# Patient Record
Sex: Male | Born: 1965 | Race: Black or African American | Hispanic: No | Marital: Single | Smoking: Former smoker
Health system: Southern US, Community
[De-identification: ages and names within clinical notes are randomized; demographics above are authoritative.]

## PROBLEM LIST (undated history)

## (undated) ENCOUNTER — Emergency Department (HOSPITAL_COMMUNITY): Source: Home / Self Care

## (undated) DIAGNOSIS — I2510 Atherosclerotic heart disease of native coronary artery without angina pectoris: Secondary | ICD-10-CM

## (undated) DIAGNOSIS — T148XXA Other injury of unspecified body region, initial encounter: Secondary | ICD-10-CM

## (undated) DIAGNOSIS — F319 Bipolar disorder, unspecified: Secondary | ICD-10-CM

## (undated) DIAGNOSIS — Z8673 Personal history of transient ischemic attack (TIA), and cerebral infarction without residual deficits: Secondary | ICD-10-CM

## (undated) DIAGNOSIS — Z9289 Personal history of other medical treatment: Secondary | ICD-10-CM

## (undated) DIAGNOSIS — N179 Acute kidney failure, unspecified: Secondary | ICD-10-CM

## (undated) DIAGNOSIS — R4182 Altered mental status, unspecified: Secondary | ICD-10-CM

## (undated) DIAGNOSIS — Y249XXA Unspecified firearm discharge, undetermined intent, initial encounter: Secondary | ICD-10-CM

## (undated) DIAGNOSIS — D696 Thrombocytopenia, unspecified: Secondary | ICD-10-CM

## (undated) DIAGNOSIS — I10 Essential (primary) hypertension: Secondary | ICD-10-CM

## (undated) DIAGNOSIS — E785 Hyperlipidemia, unspecified: Secondary | ICD-10-CM

## (undated) DIAGNOSIS — W3400XA Accidental discharge from unspecified firearms or gun, initial encounter: Secondary | ICD-10-CM

## (undated) DIAGNOSIS — R569 Unspecified convulsions: Secondary | ICD-10-CM

## (undated) DIAGNOSIS — K219 Gastro-esophageal reflux disease without esophagitis: Secondary | ICD-10-CM

## (undated) DIAGNOSIS — C641 Malignant neoplasm of right kidney, except renal pelvis: Secondary | ICD-10-CM

## (undated) DIAGNOSIS — F209 Schizophrenia, unspecified: Secondary | ICD-10-CM

## (undated) HISTORY — PX: APPENDECTOMY: SHX54

## (undated) HISTORY — DX: Acute kidney failure, unspecified: N17.9

## (undated) HISTORY — DX: Malignant neoplasm of right kidney, except renal pelvis: C64.1

## (undated) HISTORY — DX: Altered mental status, unspecified: R41.82

## (undated) HISTORY — DX: Bipolar disorder, unspecified: F31.9

## (undated) HISTORY — DX: Gastro-esophageal reflux disease without esophagitis: K21.9

## (undated) HISTORY — DX: Thrombocytopenia, unspecified: D69.6

## (undated) HISTORY — DX: Personal history of other medical treatment: Z92.89

## (undated) HISTORY — PX: OTHER SURGICAL HISTORY: SHX169

## (undated) HISTORY — DX: Hyperlipidemia, unspecified: E78.5

## (undated) HISTORY — DX: Personal history of transient ischemic attack (TIA), and cerebral infarction without residual deficits: Z86.73

---

## 2002-02-27 ENCOUNTER — Inpatient Hospital Stay (HOSPITAL_COMMUNITY): Admission: EM | Admit: 2002-02-27 | Discharge: 2002-03-01 | Payer: Self-pay

## 2003-03-30 ENCOUNTER — Emergency Department (HOSPITAL_COMMUNITY): Admission: AD | Admit: 2003-03-30 | Discharge: 2003-03-30 | Payer: Self-pay | Admitting: Emergency Medicine

## 2004-06-03 ENCOUNTER — Emergency Department (HOSPITAL_COMMUNITY): Admission: EM | Admit: 2004-06-03 | Discharge: 2004-06-03 | Payer: Self-pay | Admitting: Internal Medicine

## 2004-06-22 ENCOUNTER — Emergency Department (HOSPITAL_COMMUNITY): Admission: EM | Admit: 2004-06-22 | Discharge: 2004-06-22 | Payer: Self-pay | Admitting: Emergency Medicine

## 2004-07-31 ENCOUNTER — Emergency Department (HOSPITAL_COMMUNITY): Admission: EM | Admit: 2004-07-31 | Discharge: 2004-07-31 | Payer: Self-pay | Admitting: Emergency Medicine

## 2004-11-30 ENCOUNTER — Emergency Department (HOSPITAL_COMMUNITY): Admission: EM | Admit: 2004-11-30 | Discharge: 2004-11-30 | Payer: Self-pay | Admitting: Emergency Medicine

## 2011-04-15 ENCOUNTER — Emergency Department (HOSPITAL_COMMUNITY)
Admission: EM | Admit: 2011-04-15 | Discharge: 2011-04-16 | Disposition: A | Discharge status: Home / Self Care | Payer: Self-pay | Attending: Emergency Medicine | Admitting: Emergency Medicine

## 2011-04-15 DIAGNOSIS — E119 Type 2 diabetes mellitus without complications: Secondary | ICD-10-CM | POA: Insufficient documentation

## 2011-04-15 DIAGNOSIS — Z79899 Other long term (current) drug therapy: Secondary | ICD-10-CM | POA: Insufficient documentation

## 2011-04-15 DIAGNOSIS — R059 Cough, unspecified: Secondary | ICD-10-CM | POA: Insufficient documentation

## 2011-04-15 DIAGNOSIS — F17200 Nicotine dependence, unspecified, uncomplicated: Secondary | ICD-10-CM | POA: Insufficient documentation

## 2011-04-15 DIAGNOSIS — R05 Cough: Secondary | ICD-10-CM | POA: Insufficient documentation

## 2011-04-15 DIAGNOSIS — J069 Acute upper respiratory infection, unspecified: Secondary | ICD-10-CM | POA: Insufficient documentation

## 2011-04-15 DIAGNOSIS — I10 Essential (primary) hypertension: Secondary | ICD-10-CM | POA: Insufficient documentation

## 2011-04-15 HISTORY — DX: Essential (primary) hypertension: I10

## 2011-04-15 NOTE — ED Notes (Signed)
No answer

## 2011-04-16 ENCOUNTER — Encounter (HOSPITAL_COMMUNITY): Payer: Self-pay | Admitting: Emergency Medicine

## 2011-04-16 ENCOUNTER — Emergency Department (HOSPITAL_COMMUNITY): Payer: Self-pay

## 2011-04-16 MED ORDER — BENZONATATE 100 MG PO CAPS: 100.0000 mg | ORAL_CAPSULE | Freq: Three times a day (TID) | ORAL | Status: AC

## 2011-04-16 MED ORDER — AZITHROMYCIN 250 MG PO TABS: ORAL_TABLET | ORAL | Status: AC

## 2011-04-16 NOTE — Discharge Instructions (Signed)
Cool Mist Vaporizers Vaporizers may help relieve the symptoms of a cough and cold. By adding water to the air, mucus may become thinner and less sticky. This makes it easier to breathe and cough up secretions. Vaporizers have not been proven to show they help with colds. You should not use a vaporizer if you are allergic to mold. Cool mist vaporizers do not cause serious burns like hot mist vaporizers ("steamers"). HOME CARE INSTRUCTIONS  Follow the package instructions for your vaporizer.   Use a vaporizer that holds a large volume of water (1 to 2 gallons [5.7 to 7.5 liters]).   Do not use anything other than distilled water in the vaporizer.   Do not run the vaporizer all of the time. This can cause mold or bacteria to grow in the vaporizer.   Clean the vaporizer after each time you use it.   Clean and dry the vaporizer well before you store it.   Stop using a vaporizer if you develop worsening respiratory symptoms.  Document Released: 10/22/2003 Document Revised: 01/13/2011 Document Reviewed: 09/18/2008 [employer] Patient Information 2012 [employer]. 

## 2011-04-16 NOTE — ED Provider Notes (Signed)
History     CSN: [id number]  Arrival date & time 04/15/11  2133   First MD Initiated Contact with Patient 04/16/11 [phone]      Chief Complaint  Patient presents with  . Cough    (Consider location/radiation/quality/duration/timing/severity/associated sxs/prior treatment) Patient is a 46 y.o. male presenting with cough. The history is provided by the patient.  Cough This is a new problem. The current episode started 2 days ago. The problem occurs constantly. The problem has not changed since onset.The cough is non-productive. There has been no fever. Pertinent negatives include no chest pain, no chills, no headaches, no shortness of breath and no wheezing. Risk factors: Recently in prison. 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 He is a smoker. His past medical history does not include bronchitis, pneumonia or COPD.   patient has multiple sick contacts at home with family members with similar symptoms of cough and congestion. Moderate severity. Cough keeps him awake at night. No productive sputum or hemoptysis. No night sweats, fevers or chills. No known alleviating or aggravating factors.  Past Medical History  Diagnosis Date  . Diabetes mellitus   . Hypertension     Past Surgical History  Procedure Date  . Back surgery     No family history on file.  History  Substance Use Topics  . Smoking status: Current Everyday Smoker  . Smokeless tobacco: Not on file  . Alcohol Use: No      Review of Systems  Constitutional: Negative for fever and chills.  HENT: Negative for neck pain and neck stiffness.   Eyes: Negative for pain.  Respiratory: Positive for cough. Negative for shortness of breath, wheezing and stridor.   Cardiovascular: Negative for chest pain.  Gastrointestinal: Negative for abdominal pain.  Genitourinary: Negative for dysuria.  Musculoskeletal: Negative for back pain.  Skin: Negative for rash.  Neurological: Negative for headaches.  All other systems reviewed and are  negative.    Allergies  Penicillins and Codeine  Home Medications   Current Outpatient Rx  Name Route Sig Dispense Refill  . CARVEDILOL PO Oral Take 1 tablet by mouth daily.    . FUROSEMIDE 20 MG PO TABS Oral Take 20 mg by mouth 2 (two) times daily.    Marland Kitchen HYDROCHLOROTHIAZIDE PO Oral Take 1 tablet by mouth daily.      BP 152/103  Pulse 66  Temp 98.2 F (36.8 C)  Resp 16  SpO2 100%  Physical Exam  Constitutional: He is oriented to person, place, and time. He appears well-developed and well-nourished.  HENT:  Head: Normocephalic and atraumatic.  Eyes: Conjunctivae and EOM are normal. Pupils are equal, round, and reactive to light.  Neck: Trachea normal. Neck supple. No thyromegaly present.  Cardiovascular: Normal rate, regular rhythm, S1 normal, S2 normal and normal pulses.     No systolic murmur is present   No diastolic murmur is present  Pulses:      Radial pulses are 2+ on the right side, and 2+ on the left side.  Pulmonary/Chest: Effort normal and breath sounds normal. He has no wheezes. He has no rhonchi. He has no rales. He exhibits no tenderness.       Intermittent dry cough during exam  Abdominal: Soft. Normal appearance and bowel sounds are normal. There is no tenderness. There is no CVA tenderness and negative Murphy's sign.  Musculoskeletal:       BLE:s Calves nontender, no cords or erythema, negative Homans sign  Neurological: He is alert and oriented  to person, place, and time. He has normal strength. No cranial nerve deficit or sensory deficit. GCS eye subscore is 4. GCS verbal subscore is 5. GCS motor subscore is 6.  Skin: Skin is warm and dry. No rash noted. He is not diaphoretic.  Psychiatric: His speech is normal.       Cooperative and appropriate    ED Course  Procedures (including critical care time)  Labs Reviewed - No data to display Dg Chest 2 View  04/16/2011  *RADIOLOGY REPORT*  Clinical Data: Cough, chest pain.  CHEST - 2 VIEW  Comparison:  03/30/2003  Findings: Well aerated.  No focal consolidation.  No pleural effusion or pneumothorax.  Cardiomediastinal contours within normal limits.  No acute osseous abnormality.  IMPRESSION: No acute cardiopulmonary process.  Original Report Authenticated By: Waneta Martins, M.D.    MDM   Clinical URI symptoms with chest x-ray obtained and reviewed as above. No hypoxia. No wheezing. Plan Tylenol c codeine for cough and followup with primary care physician as needed. No symptoms of active TB.         Sunnie Nielsen, MD 04/16/11 [phone]

## 2011-04-16 NOTE — ED Notes (Signed)
PT. REPORTS PERSISTENT DRY COUGH WITH NASAL CONGESTION FOR 2 DAYS.

## 2011-07-17 ENCOUNTER — Emergency Department (HOSPITAL_COMMUNITY)
Admission: EM | Admit: 2011-07-17 | Discharge: 2011-07-17 | Disposition: A | Discharge status: Home / Self Care | Payer: Self-pay | Attending: Emergency Medicine | Admitting: Emergency Medicine

## 2011-07-17 ENCOUNTER — Encounter (HOSPITAL_COMMUNITY): Payer: Self-pay | Admitting: *Deleted

## 2011-07-17 DIAGNOSIS — F17200 Nicotine dependence, unspecified, uncomplicated: Secondary | ICD-10-CM | POA: Insufficient documentation

## 2011-07-17 DIAGNOSIS — E119 Type 2 diabetes mellitus without complications: Secondary | ICD-10-CM | POA: Insufficient documentation

## 2011-07-17 DIAGNOSIS — Z8673 Personal history of transient ischemic attack (TIA), and cerebral infarction without residual deficits: Secondary | ICD-10-CM | POA: Insufficient documentation

## 2011-07-17 DIAGNOSIS — M5430 Sciatica, unspecified side: Secondary | ICD-10-CM | POA: Insufficient documentation

## 2011-07-17 DIAGNOSIS — I10 Essential (primary) hypertension: Secondary | ICD-10-CM | POA: Insufficient documentation

## 2011-07-17 MED ORDER — PREDNISONE 20 MG PO TABS: 20.0000 mg | ORAL_TABLET | Freq: Two times a day (BID) | ORAL | Status: DC

## 2011-07-17 MED ORDER — HYDROCODONE-ACETAMINOPHEN 5-325 MG PO TABS
ORAL_TABLET | ORAL | Status: AC
  Administered 2011-07-17: 2 via ORAL
  Filled 2011-07-17: qty 2

## 2011-07-17 MED ORDER — HYDROCODONE-ACETAMINOPHEN 5-325 MG PO TABS: 1.0000 | ORAL_TABLET | ORAL | Status: DC | PRN

## 2011-07-17 MED ORDER — HYDROCODONE-ACETAMINOPHEN 5-325 MG PO TABS
2.0000 | ORAL_TABLET | Freq: Once | ORAL | Status: AC
  Administered 2011-07-17: 2 via ORAL

## 2011-07-17 NOTE — ED Notes (Signed)
Reports having pain to posterior upper right leg, thought it was due to spider or snake bite, denies any abscess or wound to leg, denies injury to leg. Ambulatory at triage.

## 2011-07-17 NOTE — Discharge Instructions (Signed)
Use heat on the sore area 3 or 4 times a day. See the Dr. of your choice if not better in one or 2 weeks. Do not drive or work when taking the narcotic pain reliever.   Sciatica Sciatica is a name for lower back pain caused by pressure on the sciatic nerve. The back pain can spread to the buttocks and back of the leg. HOME CARE   Rest as much as possible.   Only take medicine as told by your doctor.   Apply cold or heat to your back as told by your doctor.   Do not bend, lift, or sit for a long time until your pain is better.   Do not do anything that makes the condition worse.   Start normal activity when the pain is better.   Keep all follow-up visits.  GET HELP RIGHT AWAY IF:   There is more pain.   There is weakness or numbness in the legs.   You cannot control when you poop (bowel movement) or pee (urinate).  MAKE SURE YOU:   Understand these instructions.   Will watch this condition. o  Will get help right away if you are not doing well or get worse.  Document Released: 11/03/2007 Document Revised: 01/13/2011 Document Reviewed: 11/03/2007 [facility] Patient Information 2012 [place], [place].  Marland Kitchenres

## 2011-07-17 NOTE — ED Notes (Signed)
Called pt to come back to room and no answer

## 2011-07-17 NOTE — ED Notes (Signed)
Family at bedside. 

## 2011-07-17 NOTE — ED Provider Notes (Signed)
History   This chart was scribed for Flint Melter, MD scribed by Magnus Sinning. The patient was seen in room STRE5/STRE5 seen at     [facility]: [id number]  Arrival date & time 07/17/11  1413   None     Chief Complaint  Patient presents with  . Leg Pain    (Consider location/radiation/quality/duration/timing/severity/associated sxs/prior treatment) HPI Stephen [name] is a 46 y.o. male who presents to the Emergency Department complaining of constant moderate right leg pain with associated back and buttock  pain, onset 3 days.Says 2 days ago he was taking Grandmother's hydrocodone with relief, but states he had to stop because she needed medication for her hip pain treatment. Patient denies recent injury, fever, urinary or BM difficulties. Past Medical History  Diagnosis Date  . Diabetes mellitus   . Hypertension   . Stroke     Past Surgical History  Procedure Date  . Back surgery     History reviewed. No pertinent family history.  History  Substance Use Topics  . Smoking status: Current Everyday Smoker  . Smokeless tobacco: Not on file  . Alcohol Use: No      Review of Systems 10 Systems reviewed and are negative for acute change except as noted in the HPI. Allergies  Penicillins and Codeine  Home Medications   Current Outpatient Rx  Name Route Sig Dispense Refill  . CARVEDILOL PO Oral Take 1 tablet by mouth daily.    . FUROSEMIDE 20 MG PO TABS Oral Take 20 mg by mouth 2 (two) times daily.    Marland Kitchen HYDROCHLOROTHIAZIDE PO Oral Take 1 tablet by mouth daily.    Marland Kitchen HYDROCODONE-ACETAMINOPHEN 5-325 MG PO TABS Oral Take 1 tablet by mouth every 4 (four) hours as needed for pain. 20 tablet 0  . PREDNISONE 20 MG PO TABS Oral Take 1 tablet (20 mg total) by mouth 2 (two) times daily. 10 tablet 0    BP 135/84  Pulse 87  Temp(Src) 98.7 F (37.1 C) (Oral)  Resp 19  SpO2 96%  Physical Exam  Nursing note and vitals reviewed. Constitutional: He is oriented to person, place, and  time. He appears well-developed and well-nourished. No distress.  HENT:  Head: Normocephalic and atraumatic.  Eyes: Conjunctivae and EOM are normal.  Neck: Neck supple. No tracheal deviation present.  Cardiovascular: Normal rate.   Pulmonary/Chest: Effort normal. No respiratory distress.       Lungs clear  Musculoskeletal: Normal range of motion. He exhibits tenderness.       Right paravertebral lumbar tenderness.Tender posterior thigh. No deformity or abnormality.   Neurological: He is alert and oriented to person, place, and time.  Skin: Skin is warm and dry.  Psychiatric: He has a normal mood and affect. His behavior is normal.    ED Course  Procedures (including critical care time) DIAGNOSTIC STUDIES: Oxygen Saturation is 96% on room air, normal by my interpretation.    COORDINATION OF CARE:  Labs Reviewed - No data to display No results found.   1. Sciatica       MDM  Sciatica, cause unclear. Doubt spinal stenosis, spinal myelopathy, or occult fracture.   I personally performed the services described in this documentation, which was scribed in my presence. The recorded information has been reviewed and considered.    Plan: Home Medications- prednisone and Norco; Home Treatments- Heat treatment; Recommended follow up- PCP prn        Flint Melter, MD 07/17/11 1940

## 2011-07-22 ENCOUNTER — Encounter (HOSPITAL_COMMUNITY): Payer: Self-pay | Admitting: Emergency Medicine

## 2011-07-22 ENCOUNTER — Emergency Department (HOSPITAL_COMMUNITY)
Admission: EM | Admit: 2011-07-22 | Discharge: 2011-07-23 | Disposition: A | Discharge status: Home / Self Care | Payer: Self-pay | Attending: Emergency Medicine | Admitting: Emergency Medicine

## 2011-07-22 DIAGNOSIS — Z885 Allergy status to narcotic agent status: Secondary | ICD-10-CM | POA: Insufficient documentation

## 2011-07-22 DIAGNOSIS — M5430 Sciatica, unspecified side: Secondary | ICD-10-CM | POA: Insufficient documentation

## 2011-07-22 DIAGNOSIS — I10 Essential (primary) hypertension: Secondary | ICD-10-CM | POA: Insufficient documentation

## 2011-07-22 DIAGNOSIS — M545 Low back pain, unspecified: Secondary | ICD-10-CM | POA: Insufficient documentation

## 2011-07-22 DIAGNOSIS — Z8673 Personal history of transient ischemic attack (TIA), and cerebral infarction without residual deficits: Secondary | ICD-10-CM | POA: Insufficient documentation

## 2011-07-22 DIAGNOSIS — M549 Dorsalgia, unspecified: Secondary | ICD-10-CM

## 2011-07-22 DIAGNOSIS — Z87828 Personal history of other (healed) physical injury and trauma: Secondary | ICD-10-CM | POA: Insufficient documentation

## 2011-07-22 DIAGNOSIS — Z880 Allergy status to penicillin: Secondary | ICD-10-CM | POA: Insufficient documentation

## 2011-07-22 DIAGNOSIS — F17200 Nicotine dependence, unspecified, uncomplicated: Secondary | ICD-10-CM | POA: Insufficient documentation

## 2011-07-22 DIAGNOSIS — Z79899 Other long term (current) drug therapy: Secondary | ICD-10-CM | POA: Insufficient documentation

## 2011-07-22 HISTORY — DX: Unspecified firearm discharge, undetermined intent, initial encounter: Y24.9XXA

## 2011-07-22 HISTORY — DX: Other injury of unspecified body region, initial encounter: T14.8XXA

## 2011-07-22 HISTORY — DX: Accidental discharge from unspecified firearms or gun, initial encounter: W34.00XA

## 2011-07-22 NOTE — ED Notes (Signed)
[id number]> Expected date:<BR> Expected time:<BR> Means of arrival:<BR> Comments:<BR> EMS 211 GC, 46 yom back pain
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
## 2011-07-23 ENCOUNTER — Emergency Department (HOSPITAL_COMMUNITY): Payer: Self-pay

## 2011-07-23 ENCOUNTER — Encounter (HOSPITAL_COMMUNITY): Payer: Self-pay | Admitting: Emergency Medicine

## 2011-07-23 LAB — BASIC METABOLIC PANEL
Calcium: 9.9 mg/dL (ref 8.4–10.5)
Creatinine, Ser: 1.34 mg/dL (ref 0.50–1.35)
GFR calc Af Amer: 72 mL/min — ABNORMAL LOW (ref >90)
GFR calc non Af Amer: 62 mL/min — ABNORMAL LOW (ref >90)
Sodium: 138 mEq/L (ref 135–145)

## 2011-07-23 LAB — CBC
MCH: 30.5 pg (ref 26.0–34.0)
MCHC: 34.8 g/dL (ref 30.0–36.0)
MCV: 87.5 fL (ref 78.0–100.0)
Platelets: 144 10*3/uL — ABNORMAL LOW (ref 150–400)
RDW: 12.9 % (ref 11.5–15.5)

## 2011-07-23 LAB — URINALYSIS, ROUTINE W REFLEX MICROSCOPIC
Glucose, UA: NEGATIVE mg/dL
Leukocytes, UA: NEGATIVE
Nitrite: NEGATIVE
Protein, ur: NEGATIVE mg/dL
Urobilinogen, UA: 1 mg/dL (ref 0.0–1.0)

## 2011-07-23 MED ORDER — NAPROXEN 500 MG PO TABS: 500.0000 mg | ORAL_TABLET | Freq: Two times a day (BID) | ORAL | Status: DC

## 2011-07-23 MED ORDER — OXYCODONE-ACETAMINOPHEN 10-325 MG PO TABS: 1.0000 | ORAL_TABLET | ORAL | Status: DC | PRN

## 2011-07-23 MED ORDER — DEXTROSE 50 % IV SOLN
1.0000 | Freq: Once | INTRAVENOUS | Status: DC
  Filled 2011-07-23: qty 50

## 2011-07-23 MED ORDER — OXYCODONE-ACETAMINOPHEN 5-325 MG PO TABS
2.0000 | ORAL_TABLET | Freq: Once | ORAL | Status: AC
  Administered 2011-07-23: 2 via ORAL
  Filled 2011-07-23: qty 2

## 2011-07-23 NOTE — ED Provider Notes (Signed)
Medical screening examination/treatment/procedure(s) were performed by non-physician practitioner and as supervising physician I was immediately available for consultation/collaboration.  Kedron Uno M Jeane Cashatt, MD 07/23/11 2141 

## 2011-07-23 NOTE — Discharge Instructions (Signed)
You were seen and evaluated today for your complaints of persistent low back pains. At this time your providers feel that you should continue to followup with your complaints to primary care provider for further evaluation and treatment.   Back Pain, Adult Low back pain is very common. About 1 in 5 people have back pain.The cause of low back pain is rarely dangerous. The pain often gets better over time.About half of people with a sudden onset of back pain feel better in just 2 weeks. About 8 in 10 people feel better by 6 weeks.  CAUSES Some common causes of back pain include:  Strain of the muscles or ligaments supporting the spine.   Wear and tear (degeneration) of the spinal discs.   Arthritis.   Direct injury to the back.  DIAGNOSIS Most of the time, the direct cause of low back pain is not known.However, back pain can be treated effectively even when the exact cause of the pain is unknown.Answering your caregiver's questions about your overall health and symptoms is one of the most accurate ways to make sure the cause of your pain is not dangerous. If your caregiver needs more information, he or she may order lab work or imaging tests (X-rays or MRIs).However, even if imaging tests show changes in your back, this usually does not require surgery. HOME CARE INSTRUCTIONS For many people, back pain returns.Since low back pain is rarely dangerous, it is often a condition that people can learn to [facility] their own.   Remain active. It is stressful on the back to sit or stand in one place. Do not sit, drive, or stand in one place for more than 30 minutes at a time. Take short walks on level surfaces as soon as pain allows.Try to increase the length of time you walk each day.   Do not stay in bed.Resting more than 1 or 2 days can delay your recovery.   Do not avoid exercise or work.Your body is made to move.It is not dangerous to be active, even though your back may hurt.Your back  will likely heal faster if you return to being active before your pain is gone.   Pay attention to your body when you bend and lift. Many people have less discomfortwhen lifting if they bend their knees, keep the load close to their bodies,and avoid twisting. Often, the most comfortable positions are those that put less stress on your recovering back.   Find a comfortable position to sleep. Use a firm mattress and lie on your side with your knees slightly bent. If you lie on your back, put a pillow under your knees.   Only take over-the-counter or prescription medicines as directed by your caregiver. Over-the-counter medicines to reduce pain and inflammation are often the most helpful.Your caregiver may prescribe muscle relaxant drugs.These medicines help dull your pain so you can more quickly return to your normal activities and healthy exercise.   Put ice on the injured area.   Put ice in a plastic bag.   Place a towel between your skin and the bag.   Leave the ice on for 15 to 20 minutes, 3 to 4 times a day for the first 2 to 3 days. After that, ice and heat may be alternated to reduce pain and spasms.   Ask your caregiver about trying back exercises and gentle massage. This may be of some benefit.   Avoid feeling anxious or stressed.Stress increases muscle tension and can worsen back pain.It is  important to recognize when you are anxious or stressed and learn ways to manage it.Exercise is a great option.  SEEK MEDICAL CARE IF:  You have pain that is not relieved with rest or medicine.   You have pain that does not improve in 1 week.   You have new symptoms.   You are generally not feeling well.  SEEK IMMEDIATE MEDICAL CARE IF:   You have pain that radiates from your back into your legs.   You develop new bowel or bladder control problems.   You have unusual weakness or numbness in your arms or legs.   You develop nausea or vomiting.   You develop abdominal pain.    You feel faint.  Document Released: 01/24/2005 Document Revised: 01/13/2011 Document Reviewed: 06/14/2010 [facility] Patient Information 2012 [place], [place].  Back Exercises Back exercises help treat and prevent back injuries. The goal of back exercises is to increase the strength of your abdominal and back muscles and the flexibility of your back. These exercises should be started when you no longer have back pain. Back exercises include:  Pelvic Tilt. Lie on your back with your knees bent. Tilt your pelvis until the lower part of your back is against the floor. Hold this position 5 to 10 sec and repeat 5 to 10 times.   Knee to Chest. Pull first 1 knee up against your chest and hold for 20 to 30 seconds, repeat this with the other knee, and then both knees. This may be done with the other leg straight or bent, whichever feels better.   Sit-Ups or Curl-Ups. Bend your knees 90 degrees. Start with tilting your pelvis, and do a partial, slow sit-up, lifting your trunk only 30 to 45 degrees off the floor. Take at least 2 to 3 seconds for each sit-up. Do not do sit-ups with your knees out straight. If partial sit-ups are difficult, simply do the above but with only tightening your abdominal muscles and holding it as directed.   Hip-Lift. Lie on your back with your knees flexed 90 degrees. Push down with your feet and shoulders as you raise your hips a couple inches off the floor; hold for 10 seconds, repeat 5 to 10 times.   Back arches. Lie on your stomach, propping yourself up on bent elbows. Slowly press on your hands, causing an arch in your low back. Repeat 3 to 5 times. Any initial stiffness and discomfort should lessen with repetition over time.   Shoulder-Lifts. Lie face down with arms beside your body. Keep hips and torso pressed to floor as you slowly lift your head and shoulders off the floor.  Do not overdo your exercises, especially in the beginning. Exercises may cause you some mild back  discomfort which lasts for a few minutes; however, if the pain is more severe, or lasts for more than 15 minutes, do not continue exercises until you see your caregiver. Improvement with exercise therapy for back problems is slow.  See your caregivers for assistance with developing a proper back exercise program. Document Released: 03/03/2004 Document Revised: 01/13/2011 Document Reviewed: 01/24/2005 [facility] Patient Information 2012 [place], [place].     RESOURCE GUIDE  Chronic Pain Problems: Contact [facility]  [phone] Patients need to be referred by their primary care doctor.  Insufficient Money for Medicine: Contact United Way:  call "211" or [facility] 8071603302.  No Primary Care Doctor: - Call [facility]  [phone] - can help you locate a primary care  doctor that  accepts your insurance, provides certain services, etc. - Physician Referral Service- [phone]  Agencies that provide inexpensive medical care: - [facility]  [phone] - [facility]  [phone] - [facility]  [phone] [facility]  [phone] - Planned Parenthood  [phone] [facility]  [phone]  Medicaid-accepting [facility] Providers: - [facility]- [address]  [phone], Mon-Fri 9am-7pm, Sat 9am-1pm - [facility]- [address] [place], [address] [place]  [phone] - [facility]- [address] [place]  [phone] [facility]- [address]  [phone] - Renaye Rakers- [address] [place], [address], [phone]  Only accepts [place] patients after they have their name  applied to their card  Self Pay (no insurance) in [place]: - Sickle Cell Patients: Dr Willey Blade, [facility]  [address] [place], [phone] - [facility]- [address] [place]  [phone]       [facility] [place]- [address]       -     [facility]- see information above (Speak to [employer] if you do not have insurance)       -  [facility]- [address] [place], [phone]       -  [facility]- [address],  [phone]       -  [facility]- [address], [phone]       -  Dr Julio Sicks-  [address], [place], [phone]       -  [facility] Inpatient [facility]gent Care- [address] [phone]       -  [facility]- [address], [phone], also [address] [phone]       -    [facility]- [address] [place], [phone], 1st & 3rd Saturday   every month, 10am-1pm  1) Find a Doctor and Pay Out of Pocket Although you won't have to find out who is covered by your insurance plan, it is a good idea to ask around and get recommendations. You will then need to call the office and see if the doctor you have chosen will accept you as a new patient and what types of options they offer for patients who are self-pay. Some doctors offer discounts or will set up payment plans for their patients who do not have insurance, but you will need to ask so you aren't surprised when you get to your appointment.  2) Contact Your Local Health Department Not all health departments have doctors that can see patients for sick visits, but many do, so it is worth a call to see if yours does. If you don't know where your local health department is, you can check in your phone book. The CDC also has a tool to help you locate your state's health department, and many state websites also have listings of all of their local health departments.  3) Find a Walk-in Clinic If your illness is not likely to be very severe or complicated, you may want to try a walk in clinic. These are popping up all over the country in pharmacies, drugstores, and shopping centers. They're usually staffed by nurse practitioners or  physician assistants  that have been trained to treat common illnesses and complaints. They're usually fairly quick and inexpensive. However, if you have serious medical issues or chronic medical problems, these are probably not your best option  STD Testing - [facility] Department of [facility], STD Clinic, [address], [place], phone [phone] or [phone].  Monday - Friday, call for an appointment. [facility] Department of [employer], STD Clinic, [address], [place], phone [phone] or [phone].  Monday - Friday, call for an appointment.  Abuse/Neglect: [facility] Child Abuse Hotline 805-561-4249 [facility] Child Abuse Hotline (281)592-3193 (After Hours)  Emergency Shelter:  [facility] 9103511736  Maternity Homes: - [facility] [place] [facility] [phone] - [facility] [phone]) [phone]  MRSA Hotline #:   779-460-2667  [facility] Resources  [facility] [place]  [employer] [facility] Dept. [address]                 [address]         [address] [place] [address]  Blondell Reveal Phone:  [phone]                                  Phone:  [phone]                   Phone:  [phone]  [facility] Mental [facility], [phone] - [phone]       -     [facility] in [place], [address]                                  [phone], [facility] Child Abuse Hotline 416-747-9801 or (361)032-6754 (After Hours)   Behavioral Health Services  Substance Abuse Resources: - [facility] Drug [facility]  [phone] - [facility] [phone] - [facility] [phone] Floydene Flock [phone] - Residential & Outpatient  Substance Abuse Program  2204051879  Psychological Services: [facility]  [phone] Services  [phone] - [facility], [phone] [place]. [address], [place], ACCESS LINE: (443) 767-3539 or [phone], [employer]  Dental Assistance  If unable to pay or uninsured, contact:  [facility] or [facility] to become qualified for the adult dental clinic.  Patients with Medicaid: [facility] [phone], [phone] [address], [phone]  If unable to pay, or uninsured, contact [facility] [phone]) or [facility] Department [phone] in [place], [phone] in [facility]) to become qualified for the adult dental clinic  Other Low-Cost Community Dental Services: - [facility]- [address] [place], [place], [place], [address], [phone], [address]. [phone], 2nd and 4th Thursday of the month at 6:30am.  10 clients  each day by appointment, can sometimes see walk-in patients if someone does not show for an appointment. [facility]- [address] [place], [place], [address], [phone] - [facility]- [address], [place], [place], [address], [phone] - [facility]- [phone] [facility] Health Department- [phone] [facility] Department- [phone]
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
## 2011-07-23 NOTE — ED Notes (Signed)
Pt AAOx4. Given 6 honey graham crackers, 42 grams (1.5 oz) of peanut butter, 222 ml of diet coca cola, and 118 ml of orange juice.

## 2011-07-25 ENCOUNTER — Emergency Department (HOSPITAL_COMMUNITY)
Admission: EM | Admit: 2011-07-25 | Discharge: 2011-07-25 | Disposition: A | Discharge status: Home / Self Care | Payer: Self-pay | Attending: Emergency Medicine | Admitting: Emergency Medicine

## 2011-07-25 ENCOUNTER — Encounter (HOSPITAL_COMMUNITY): Payer: Self-pay | Admitting: *Deleted

## 2011-07-25 DIAGNOSIS — I10 Essential (primary) hypertension: Secondary | ICD-10-CM | POA: Insufficient documentation

## 2011-07-25 DIAGNOSIS — F17200 Nicotine dependence, unspecified, uncomplicated: Secondary | ICD-10-CM | POA: Insufficient documentation

## 2011-07-25 DIAGNOSIS — Z8673 Personal history of transient ischemic attack (TIA), and cerebral infarction without residual deficits: Secondary | ICD-10-CM | POA: Insufficient documentation

## 2011-07-25 DIAGNOSIS — M5431 Sciatica, right side: Secondary | ICD-10-CM

## 2011-07-25 DIAGNOSIS — M5430 Sciatica, unspecified side: Secondary | ICD-10-CM | POA: Insufficient documentation

## 2011-07-25 MED ORDER — CYCLOBENZAPRINE HCL 10 MG PO TABS
10.0000 mg | ORAL_TABLET | Freq: Once | ORAL | Status: AC
  Administered 2011-07-25: 10 mg via ORAL
  Filled 2011-07-25: qty 1

## 2011-07-25 MED ORDER — NAPROXEN 500 MG PO TABS: 500.0000 mg | ORAL_TABLET | Freq: Two times a day (BID) | ORAL | Status: DC

## 2011-07-25 MED ORDER — PREDNISONE 50 MG PO TABS: 50.0000 mg | ORAL_TABLET | Freq: Every day | ORAL | Status: DC

## 2011-07-25 MED ORDER — CYCLOBENZAPRINE HCL 10 MG PO TABS: 10.0000 mg | ORAL_TABLET | Freq: Three times a day (TID) | ORAL | Status: DC | PRN

## 2011-07-25 MED ORDER — HYDROCODONE-ACETAMINOPHEN 5-325 MG PO TABS
1.0000 | ORAL_TABLET | Freq: Once | ORAL | Status: AC
  Administered 2011-07-25: 1 via ORAL
  Filled 2011-07-25: qty 1

## 2011-07-25 MED ORDER — HYDROCODONE-ACETAMINOPHEN 5-325 MG PO TABS: 1.0000 | ORAL_TABLET | ORAL | Status: DC | PRN

## 2011-07-25 MED ORDER — PREDNISONE 20 MG PO TABS
60.0000 mg | ORAL_TABLET | Freq: Once | ORAL | Status: AC
  Administered 2011-07-25: 60 mg via ORAL
  Filled 2011-07-25: qty 1

## 2011-07-25 MED ORDER — IBUPROFEN 800 MG PO TABS
800.0000 mg | ORAL_TABLET | Freq: Once | ORAL | Status: AC
  Administered 2011-07-25: 800 mg via ORAL
  Filled 2011-07-25: qty 1

## 2011-07-25 NOTE — ED Notes (Signed)
Buss pass requsted and given at d/c

## 2011-07-25 NOTE — ED Notes (Signed)
[facility] on Thursday. States that he needs pain medication to hold him over till then.

## 2011-07-25 NOTE — ED Notes (Signed)
Pt c/o pain in his lower back for 3 weeks.  He has been here x 3 for the same.  He is requesting percocet.

## 2011-07-25 NOTE — Discharge Instructions (Signed)
Please keep your appointment with [facility].  Sciatica Sciatica is a weakness and/or changes in sensation (tingling, jolts, hot and cold, numbness) along the path the sciatic nerve travels. Irritation or damage to lumbar nerve roots is often also referred to as lumbar radiculopathy.  Lumbar radiculopathy (Sciatica) is the most common form of this problem. Radiculopathy can occur in any of the nerves coming out of the spinal cord. The problems caused depend on which nerves are involved. The sciatic nerve is the large nerve supplying the branches of nerves going from the hip to the toes. It often causes a numbness or weakness in the skin and/or muscles that the sciatic nerve serves. It also may cause symptoms (problems) of pain, burning, tingling, or electric shock-like feelings in the path of this nerve. This usually comes from injury to the fibers that make up the sciatic nerve. Some of these symptoms are low back pain and/or unpleasant feelings in the following areas:  From the mid-buttock down the back of the leg to the back of the knee.   And/or the outside of the calf and top of the foot.   And/or behind the inner ankle to the sole of the foot.  CAUSES   Herniated or slipped disc. Discs are the little cushions between the bones in the back.   Pressure by the piriformis muscle in the buttock on the sciatic nerve (Piriformis Syndrome).   Misalignment of the bones in the lower back and buttocks (Sacroiliac Joint Derangement).   Narrowing of the spinal canal that puts pressure on or pinches the fibers that make up the sciatic nerve.   A slipped vertebra that is out of line with those above or beneath it.   Abnormality of the nervous system itself so that nerve fibers do not transmit signals properly, especially to feet and calves (neuropathy).   Tumor (this is rare).  Your caregiver can usually determine the cause of your sciatica and begin the treatment most likely to help  you. TREATMENT  Taking over-the-counter painkillers, physical therapy, rest, exercise, spinal manipulation, and injections of anesthetics and/or steroids may be used. Surgery, acupuncture, and Yoga can also be effective. Mind over matter techniques, mental imagery, and changing factors such as your bed, chair, desk height, posture, and activities are other treatments that may be helpful. You and your caregiver can help determine what is best for you. With proper diagnosis, the cause of most sciatica can be identified and removed. Communication and cooperation between your caregiver and you is essential. If you are not successful immediately, do not be discouraged. With time, a proper treatment can be found that will make you comfortable. HOME CARE INSTRUCTIONS   If the pain is coming from a problem in the back, applying ice to that area for 15 to 20 minutes, 3 to 4 times per day while awake, may be helpful. Put the ice in a plastic bag. Place a towel between the bag of ice and your skin.   You may exercise or perform your usual activities if these do not aggravate your pain, or as suggested by your caregiver.   Only take over-the-counter or prescription medicines for pain, discomfort, or fever as directed by your caregiver.   If your caregiver has given you a follow-up appointment, it is very important to keep that appointment. Not keeping the appointment could result in a chronic or permanent injury, pain, and disability. If there is any problem keeping the appointment, you must call back to this  facility for assistance.  SEEK IMMEDIATE MEDICAL CARE IF:   You experience loss of control of bowel or bladder.   You have increasing weakness in the trunk, buttocks, or legs.   There is numbness in any areas from the hip down to the toes.   You have difficulty walking or keeping your balance.   You have any of the above, with fever or forceful vomiting.  Document Released: 01/18/2001 Document  Revised: 01/13/2011 Document Reviewed: 09/07/2007 [facility] Patient Information 2012 [place], [place].  Naproxen and naproxen sodium oral immediate-release tablets What is this medicine? NAPROXEN (na PROX en) is a non-steroidal anti-inflammatory drug (NSAID). It is used to reduce swelling and to treat pain. This medicine may be used for dental pain, headache, or painful monthly periods. It is also used for painful joint and muscular problems such as arthritis, tendinitis, bursitis, and gout. This medicine may be used for other purposes; ask your health care provider or pharmacist if you have questions. What should I tell my health care provider before I take this medicine? They need to know if you have any of these conditions: -asthma -cigarette smoker -drink more than 3 alcohol containing drinks a day -heart disease or circulation problems such as heart failure or leg edema (fluid retention) -high blood pressure -kidney disease -liver disease -stomach bleeding or ulcers -an unusual or allergic reaction to naproxen, aspirin, other NSAIDs, other medicines, foods, dyes, or preservatives -pregnant or trying to get pregnant -breast-feeding How should I use this medicine? Take this medicine by mouth with a glass of water. Follow the directions on the prescription label. Take it with food if your stomach gets upset. Try to not lie down for at least 10 minutes after you take it. Take your medicine at regular intervals. Do not take your medicine more often than directed. Long-term, continuous use may increase the risk of heart attack or stroke. A special MedGuide will be given to you by the pharmacist with each prescription and refill. Be sure to read this information carefully each time. Talk to your pediatrician regarding the use of this medicine in children. Special care may be needed. Overdosage: If you think you have taken too much of this medicine contact a poison control center or emergency room  at once. NOTE: This medicine is only for you. Do not share this medicine with others. What if I miss a dose? If you miss a dose, take it as soon as you can. If it is almost time for your next dose, take only that dose. Do not take double or extra doses. What may interact with this medicine? -alcohol -aspirin -cidofovir -diuretics -lithium -methotrexate -other drugs for inflammation like ketorolac or prednisone -pemetrexed -probenecid -warfarin This list may not describe all possible interactions. Give your health care provider a list of all the medicines, herbs, non-prescription drugs, or dietary supplements you use. Also tell them if you smoke, drink alcohol, or use illegal drugs. Some items may interact with your medicine. What should I watch for while using this medicine? Tell your doctor or health care professional if your pain does not get better. Talk to your doctor before taking another medicine for pain. Do not treat yourself. This medicine does not prevent heart attack or stroke. In fact, this medicine may increase the chance of a heart attack or stroke. The chance may increase with longer use of this medicine and in people who have heart disease. If you take aspirin to prevent heart attack or stroke, talk with  your doctor or health care professional. Do not take other medicines that contain aspirin, ibuprofen, or naproxen with this medicine. Side effects such as stomach upset, nausea, or ulcers may be more likely to occur. Many medicines available without a prescription should not be taken with this medicine. This medicine can cause ulcers and bleeding in the stomach and intestines at any time during treatment. Do not smoke cigarettes or drink alcohol. These increase irritation to your stomach and can make it more susceptible to damage from this medicine. Ulcers and bleeding can happen without warning symptoms and can cause death. You may get drowsy or dizzy. Do not drive, use  machinery, or do anything that needs mental alertness until you know how this medicine affects you. Do not stand or sit up quickly, especially if you are an older patient. This reduces the risk of dizzy or fainting spells. This medicine can cause you to bleed more easily. Try to avoid damage to your teeth and gums when you brush or floss your teeth. What side effects may I notice from receiving this medicine? Side effects that you should report to your doctor or health care professional as soon as possible: -black or bloody stools, blood in the urine or vomit -blurred vision -chest pain -difficulty breathing or wheezing -nausea or vomiting -severe stomach pain -skin rash, skin redness, blistering or peeling skin, hives, or itching -slurred speech or weakness on one side of the body -swelling of eyelids, throat, lips -unexplained weight gain or swelling -unusually weak or tired -yellowing of eyes or skin Side effects that usually do not require medical attention (report to your doctor or health care professional if they continue or are bothersome): -constipation -headache -heartburn This list may not describe all possible side effects. Call your doctor for medical advice about side effects. You may report side effects to FDA at 1-800-FDA-1088. Where should I keep my medicine? Keep out of the reach of children. Store at room temperature between 15 and 30 degrees C (59 and 86 degrees F). Keep container tightly closed. Throw away any unused medicine after the expiration date. NOTE: This sheet is a summary. It may not cover all possible information. If you have questions about this medicine, talk to your doctor, pharmacist, or health care provider.  2012, [employer]. (01/26/2009 8:10:16 PM)  Cyclobenzaprine tablets What is this medicine? CYCLOBENZAPRINE (sye kloe BEN za preen) is a muscle relaxer. It is used to treat muscle pain, spasms, and stiffness. This medicine may be used  for other purposes; ask your health care provider or pharmacist if you have questions. What should I tell my health care provider before I take this medicine? They need to know if you have any of these conditions: -heart disease, irregular heartbeat, or previous heart attack -liver disease -thyroid problem -an unusual or allergic reaction to cyclobenzaprine, tricyclic antidepressants, lactose, other medicines, foods, dyes, or preservatives -pregnant or trying to get pregnant -breast-feeding How should I use this medicine? Take this medicine by mouth with a glass of water. Follow the directions on the prescription label. If this medicine upsets your stomach, take it with food or milk. Take your medicine at regular intervals. Do not take it more often than directed. Talk to your pediatrician regarding the use of this medicine in children. Special care may be needed. Overdosage: If you think you have taken too much of this medicine contact a poison control center or emergency room at once. NOTE: This medicine is only for you. Do not  share this medicine with others. What if I miss a dose? If you miss a dose, take it as soon as you can. If it is almost time for your next dose, take only that dose. Do not take double or extra doses. What may interact with this medicine? Do not take this medicine with any of the following medications: -cisapride -droperidol -flecainide -grepafloxacin -halofantrine -levomethadyl -MAOIs like Carbex, Eldepryl, Marplan, Nardil, and Parnate -nilotinib -pimozide -probucol -sertindole This medicine may also interact with the following medications: -abarelix -alcohol -contrast dyes -dolasetron -guanethidine -medicines for cancer -medicines for depression, anxiety, or psychotic disturbances -medicines to treat an irregular heartbeat -medicines used for sleep or numbness during surgery or  procedure -methadone -octreotide -ondansetron -palonosetron -phenothiazines like chlorpromazine, mesoridazine, prochlorperazine, thioridazine -some medicines for infection like alfuzosin, chloroquine, clarithromycin, levofloxacin, mefloquine, pentamidine, troleandomycin -tramadol -vardenafil This list may not describe all possible interactions. Give your health care provider a list of all the medicines, herbs, non-prescription drugs, or dietary supplements you use. Also tell them if you smoke, drink alcohol, or use illegal drugs. Some items may interact with your medicine. What should I watch for while using this medicine? Check with your doctor or health care professional if your condition does not improve within 1 to 3 weeks. You may get drowsy or dizzy when you first start taking the medicine or change doses. Do not drive, use machinery, or do anything that may be dangerous until you know how the medicine affects you. Stand or sit up slowly. Your mouth may get dry. Drinking water, chewing sugarless gum, or sucking on hard candy may help. What side effects may I notice from receiving this medicine? Side effects that you should report to your doctor or health care professional as soon as possible: -allergic reactions like skin rash, itching or hives, swelling of the face, lips, or tongue -chest pain -fast heartbeat -hallucinations -seizures -vomiting Side effects that usually do not require medical attention (report to your doctor or health care professional if they continue or are bothersome): -headache This list may not describe all possible side effects. Call your doctor for medical advice about side effects. You may report side effects to FDA at 1-800-FDA-1088. Where should I keep my medicine? Keep out of the reach of children. Store at room temperature between 15 and 30 degrees C (59 and 86 degrees F). Keep container tightly closed. Throw away any unused medicine after the expiration  date. NOTE: This sheet is a summary. It may not cover all possible information. If you have questions about this medicine, talk to your doctor, pharmacist, or health care provider.  2012, [employer]. (05/07/2007 10:26:21 PM)  Prednisone tablets What is this medicine? PREDNISONE (PRED ni sone) is a corticosteroid. It is commonly used to treat inflammation of the skin, joints, lungs, and other organs. Common conditions treated include asthma, allergies, and arthritis. It is also used for other conditions, such as blood disorders and diseases of the adrenal glands. This medicine may be used for other purposes; ask your health care provider or pharmacist if you have questions. What should I tell my health care provider before I take this medicine? They need to know if you have any of these conditions: -Cushing's syndrome -diabetes -glaucoma -heart disease -high blood pressure -infection (especially a virus infection such as chickenpox, cold sores, or herpes) -kidney disease -liver disease -mental illness -myasthenia gravis -osteoporosis -seizures -stomach or intestine problems -thyroid disease -an unusual or allergic reaction to lactose, prednisone, other medicines, foods, dyes,  or preservatives -pregnant or trying to get pregnant -breast-feeding How should I use this medicine? Take this medicine by mouth with a glass of water. Follow the directions on the prescription label. Take this medicine with food. If you are taking this medicine once a day, take it in the morning. Do not take more medicine than you are told to take. Do not suddenly stop taking your medicine because you may develop a severe reaction. Your doctor will tell you how much medicine to take. If your doctor wants you to stop the medicine, the dose may be slowly lowered over time to avoid any side effects. Talk to your pediatrician regarding the use of this medicine in children. Special care may be  needed. Overdosage: If you think you have taken too much of this medicine contact a poison control center or emergency room at once. NOTE: This medicine is only for you. Do not share this medicine with others. What if I miss a dose? If you miss a dose, take it as soon as you can. If it is almost time for your next dose, talk to your doctor or health care professional. You may need to miss a dose or take an extra dose. Do not take double or extra doses without advice. What may interact with this medicine? Do not take this medicine with any of the following medications: -metyrapone -mifepristone This medicine may also interact with the following medications: -aminoglutethimide -amphotericin B -aspirin and aspirin-like medicines -barbiturates -certain medicines for diabetes, like glipizide or glyburide -cholestyramine -cholinesterase inhibitors -cyclosporine -digoxin -diuretics -ephedrine -male hormones, like estrogens and birth control pills -isoniazid -ketoconazole -NSAIDS, medicines for pain and inflammation, like ibuprofen or naproxen -phenytoin -rifampin -toxoids -vaccines -warfarin This list may not describe all possible interactions. Give your health care provider a list of all the medicines, herbs, non-prescription drugs, or dietary supplements you use. Also tell them if you smoke, drink alcohol, or use illegal drugs. Some items may interact with your medicine. What should I watch for while using this medicine? Visit your doctor or health care professional for regular checks on your progress. If you are taking this medicine over a prolonged period, carry an identification card with your name and address, the type and dose of your medicine, and your doctor's name and address. This medicine may increase your risk of getting an infection. Tell your doctor or health care professional if you are around anyone with measles or chickenpox, or if you develop sores or blisters that do  not heal properly. If you are going to have surgery, tell your doctor or health care professional that you have taken this medicine within the last twelve months. Ask your doctor or health care professional about your diet. You may need to lower the amount of salt you eat. This medicine may affect blood sugar levels. If you have diabetes, check with your doctor or health care professional before you change your diet or the dose of your diabetic medicine. What side effects may I notice from receiving this medicine? Side effects that you should report to your doctor or health care professional as soon as possible: -allergic reactions like skin rash, itching or hives, swelling of the face, lips, or tongue -changes in emotions or moods -changes in vision -depressed mood -eye pain -fever or chills, cough, sore throat, pain or difficulty passing urine -increased thirst -swelling of ankles, feet Side effects that usually do not require medical attention (report to your doctor or health care professional if they  continue or are bothersome): -confusion, excitement, restlessness -headache -nausea, vomiting -skin problems, acne, thin and shiny skin -trouble sleeping -weight gain This list may not describe all possible side effects. Call your doctor for medical advice about side effects. You may report side effects to FDA at 1-800-FDA-1088. Where should I keep my medicine? Keep out of the reach of children. Store at room temperature between 15 and 30 degrees C (59 and 86 degrees F). Protect from light. Keep container tightly closed. Throw away any unused medicine after the expiration date. NOTE: This sheet is a summary. It may not cover all possible information. If you have questions about this medicine, talk to your doctor, pharmacist, or health care provider.  2012, [employer]. (09/09/2010 10:57:14 AM)  Acetaminophen; Hydrocodone tablets or capsules What is this  medicine? ACETAMINOPHEN; HYDROCODONE (a set a MEE noe fen; hye droe KOE done) is a pain reliever. It is used to treat mild to moderate pain. This medicine may be used for other purposes; ask your health care provider or pharmacist if you have questions. What should I tell my health care provider before I take this medicine? They need to know if you have any of these conditions: -brain tumor -Crohn's disease, inflammatory bowel disease, or ulcerative colitis -drink more than 3 alcohol-containing drinks per day -drug abuse or addiction -head injury -heart or circulation problems -kidney disease or problems going to the bathroom -liver disease -lung disease, asthma, or breathing problems -an unusual or allergic reaction to acetaminophen, hydrocodone, other opioid analgesics, other medicines, foods, dyes, or preservatives -pregnant or trying to get pregnant -breast-feeding How should I use this medicine? Take this medicine by mouth. Swallow it with a full glass of water. Follow the directions on the prescription label. If the medicine upsets your stomach, take the medicine with food or milk. Do not take more than you are told to take. Talk to your pediatrician regarding the use of this medicine in children. This medicine is not approved for use in children. Overdosage: If you think you have taken too much of this medicine contact a poison control center or emergency room at once. NOTE: This medicine is only for you. Do not share this medicine with others. What if I miss a dose? If you miss a dose, take it as soon as you can. If it is almost time for your next dose, take only that dose. Do not take double or extra doses. What may interact with this medicine? -alcohol or medicines that contain alcohol -antihistamines -isoniazid -medicines for depression, anxiety, or psychotic disturbances -medicines for pain including pentazocine, buprenorphine, butorphanol, nalbuphine, tramadol, and  propoxyphene -medicines for sleep -muscle relaxants -naltrexone -phenobarbital -ritonavir This list may not describe all possible interactions. Give your health care provider a list of all the medicines, herbs, non-prescription drugs, or dietary supplements you use. Also tell them if you smoke, drink alcohol, or use illegal drugs. Some items may interact with your medicine. What should I watch for while using this medicine? Tell your doctor or health care professional if your pain does not go away, if it gets worse, or if you have new or a different type of pain. You may develop tolerance to the medicine. Tolerance means that you will need a higher dose of the medicine for pain relief. Tolerance is normal and is expected if you take the medicine for a long time. Do not suddenly stop taking your medicine because you may develop a severe reaction. Your body becomes used to  the medicine. This does NOT mean you are addicted. Addiction is a behavior related to getting and using a drug for a non-medical reason. If you have pain, you have a medical reason to take pain medicine. Your doctor will tell you how much medicine to take. If your doctor wants you to stop the medicine, the dose will be slowly lowered over time to avoid any side effects. You may get drowsy or dizzy when you first start taking the medicine or change doses. Do not drive, use machinery, or do anything that may be dangerous until you know how the medicine affects you. Stand or sit up slowly. The medicine will cause constipation. Try to have a bowel movement at least every 2 to 3 days. If you do not have a bowel movement for 3 days, call your doctor or health care professional. Too much acetaminophen can be very dangerous. Do not take Tylenol (acetaminophen) or medicines that contain acetaminophen with this medicine. Many non-prescription medicines contain acetaminophen. Always read the labels carefully. What side effects may I notice from  receiving this medicine? Side effects that you should report to your doctor or health care professional as soon as possible: -allergic reactions like skin rash, itching or hives, swelling of the face, lips, or tongue -breathing problems -confusion -feeling faint or lightheaded, falls -stomach pain -yellowing of the eyes or skin Side effects that usually do not require medical attention (report to your doctor or health care professional if they continue or are bothersome): -nausea, vomiting -stomach upset This list may not describe all possible side effects. Call your doctor for medical advice about side effects. You may report side effects to FDA at 1-800-FDA-1088. Where should I keep my medicine? Keep out of the reach of children. This medicine can be abused. Keep your medicine in a safe place to protect it from theft. Do not share this medicine with anyone. Selling or giving away this medicine is dangerous and against the law. Store at room temperature between 15 and 30 degrees C (59 and 86 degrees F). Protect from light. Keep container tightly closed. Throw away any unused medicine after the expiration date. NOTE: This sheet is a summary. It may not cover all possible information. If you have questions about this medicine, talk to your doctor, pharmacist, or health care provider.  2012, [employer]. (04/17/2007 10:25:07 AM)
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
## 2011-07-30 ENCOUNTER — Encounter (HOSPITAL_COMMUNITY): Payer: Self-pay | Admitting: Emergency Medicine

## 2011-07-30 ENCOUNTER — Emergency Department (HOSPITAL_COMMUNITY)
Admission: EM | Admit: 2011-07-30 | Discharge: 2011-07-31 | Disposition: A | Discharge status: Home / Self Care | Payer: Self-pay | Attending: Emergency Medicine | Admitting: Emergency Medicine

## 2011-07-30 ENCOUNTER — Emergency Department (HOSPITAL_COMMUNITY): Payer: Self-pay

## 2011-07-30 DIAGNOSIS — M542 Cervicalgia: Secondary | ICD-10-CM | POA: Insufficient documentation

## 2011-07-30 DIAGNOSIS — R51 Headache: Secondary | ICD-10-CM | POA: Insufficient documentation

## 2011-07-30 DIAGNOSIS — R079 Chest pain, unspecified: Secondary | ICD-10-CM | POA: Insufficient documentation

## 2011-07-30 DIAGNOSIS — Z8673 Personal history of transient ischemic attack (TIA), and cerebral infarction without residual deficits: Secondary | ICD-10-CM | POA: Insufficient documentation

## 2011-07-30 DIAGNOSIS — I10 Essential (primary) hypertension: Secondary | ICD-10-CM | POA: Insufficient documentation

## 2011-07-30 DIAGNOSIS — T07XXXA Unspecified multiple injuries, initial encounter: Secondary | ICD-10-CM | POA: Insufficient documentation

## 2011-07-30 NOTE — Discharge Instructions (Signed)
  Take Tylenol or Motrin for pain. See the Dr. of your choice for problems.   Contusion A contusion is a deep bruise. Contusions are the result of an injury that caused bleeding under the skin. The contusion may turn blue, purple, or yellow. Minor injuries will give you a painless contusion, but more severe contusions may stay painful and swollen for a few weeks.  CAUSES  A contusion is usually caused by a blow, trauma, or direct force to an area of the body. SYMPTOMS   Swelling and redness of the injured area.   Bruising of the injured area.   Tenderness and soreness of the injured area.   Pain.  DIAGNOSIS  The diagnosis can be made by taking a history and physical exam. An X-ray, CT scan, or MRI may be needed to determine if there were any associated injuries, such as fractures. TREATMENT  Specific treatment will depend on what area of the body was injured. In general, the best treatment for a contusion is resting, icing, elevating, and applying cold compresses to the injured area. Over-the-counter medicines may also be recommended for pain control. Ask your caregiver what the best treatment is for your contusion. HOME CARE INSTRUCTIONS   Put ice on the injured area.   Put ice in a plastic bag.   Place a towel between your skin and the bag.   Leave the ice on for 15 to 20 minutes, 3 to 4 times a day.   Only take over-the-counter or prescription medicines for pain, discomfort, or fever as directed by your caregiver. Your caregiver may recommend avoiding anti-inflammatory medicines (aspirin, ibuprofen, and naproxen) for 48 hours because these medicines may increase bruising.   Rest the injured area.   If possible, elevate the injured area to reduce swelling.  SEEK IMMEDIATE MEDICAL CARE IF:   You have increased bruising or swelling.   You have pain that is getting worse.   Your swelling or pain is not relieved with medicines.  MAKE SURE YOU:   Understand these  instructions.   Will watch your condition.   Will get help right away if you are not doing well or get worse.  Document Released: 11/03/2004 Document Revised: 01/13/2011 Document Reviewed: 11/29/2010 [facility] Patient Information 2012 [place], [place].
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
## 2011-08-02 ENCOUNTER — Encounter (HOSPITAL_COMMUNITY): Payer: Self-pay | Admitting: *Deleted

## 2011-08-02 DIAGNOSIS — F17200 Nicotine dependence, unspecified, uncomplicated: Secondary | ICD-10-CM | POA: Insufficient documentation

## 2011-08-02 DIAGNOSIS — M545 Low back pain, unspecified: Secondary | ICD-10-CM | POA: Insufficient documentation

## 2011-08-02 DIAGNOSIS — I10 Essential (primary) hypertension: Secondary | ICD-10-CM | POA: Insufficient documentation

## 2011-08-02 NOTE — ED Notes (Signed)
Patient states lower back pain with pain radiating down right leg into right foot with foot swelling.  Patient also states pain radiating up into neck and feels like needles stabbing into neck

## 2011-08-03 ENCOUNTER — Emergency Department (HOSPITAL_COMMUNITY)
Admission: EM | Admit: 2011-08-03 | Discharge: 2011-08-03 | Disposition: A | Discharge status: Home / Self Care | Payer: Self-pay | Attending: Emergency Medicine | Admitting: Emergency Medicine

## 2011-08-03 DIAGNOSIS — B353 Tinea pedis: Secondary | ICD-10-CM

## 2011-08-03 DIAGNOSIS — M549 Dorsalgia, unspecified: Secondary | ICD-10-CM

## 2011-08-03 MED ORDER — OXYCODONE-ACETAMINOPHEN 10-325 MG PO TABS: 1.0000 | ORAL_TABLET | ORAL | Status: DC | PRN

## 2011-08-03 MED ORDER — TERBINAFINE HCL 1 % EX CREA: TOPICAL_CREAM | Freq: Two times a day (BID) | CUTANEOUS | Status: DC

## 2011-08-03 MED ORDER — AMOXICILLIN-POT CLAVULANATE 875-125 MG PO TABS: 1.0000 | ORAL_TABLET | Freq: Two times a day (BID) | ORAL | Status: DC

## 2011-08-03 NOTE — Discharge Instructions (Signed)
You were seen and evaluated for your complaints of back pains as well as to foot pains. At this time your providers feel your symptoms are caused from any concerning or emergent conditions. You were given prescriptions for medications to help with your back pain symptoms as well as medication and cream to put over your foot for infection. Please take your medications as prescribed for the full length of time. Please followup with your doctor on Friday as planned.    Athlete's Foot Athlete's foot (tinea pedis) is a fungal infection of the skin on the feet. It often occurs on the skin between the toes or underneath the toes. It can also occur on the soles of the feet. Athlete's foot is more likely to occur in hot, humid weather. Not washing your feet or changing your socks often enough can contribute to athlete's foot. The infection can spread from person to person (contagious). CAUSES Athlete's foot is caused by a fungus. This fungus thrives in warm, moist places. Most people get athlete's foot by sharing shower stalls, towels, and wet floors with an infected person. People with weakened immune systems, including those with diabetes, may be more likely to get athlete's foot. SYMPTOMS   Itchy areas between the toes or on the soles of the feet.   White, flaky, or scaly areas between the toes or on the soles of the feet.   Tiny, intensely itchy blisters between the toes or on the soles of the feet.   Tiny cuts on the skin. These cuts can develop a bacterial infection.   Thick or discolored toenails.  DIAGNOSIS  Your caregiver can usually tell what the problem is by doing a physical exam. Your caregiver may also take a skin sample from the rash area. The skin sample may be examined under a microscope, or it may be tested to see if fungus will grow in the sample. A sample may also be taken from your toenail for testing. TREATMENT  Over-the-counter and prescription medicines can be used to kill the  fungus. These medicines are available as powders or creams. Your caregiver can suggest medicines for you. Fungal infections respond slowly to treatment. You may need to continue using your medicine for several weeks. PREVENTION   Do not share towels.   Wear sandals in wet areas, such as shared locker rooms and shared showers.   Keep your feet dry. Wear shoes that allow air to circulate. Wear cotton or wool socks.  HOME CARE INSTRUCTIONS   Take medicines as directed by your caregiver. Do not use steroid creams on athlete's foot.   Keep your feet clean and cool. Wash your feet daily and dry them thoroughly, especially between your toes.   Change your socks every day. Wear cotton or wool socks. In hot climates, you may need to change your socks 2 to 3 times per day.   Wear sandals or canvas tennis shoes with good air circulation.   If you have blisters, soak your feet in Burow's solution or Epsom salts for 20 to 30 minutes, 2 times a day to dry out the blisters. Make sure you dry your feet thoroughly afterward.  SEEK MEDICAL CARE IF:   You have a fever.   You have swelling, soreness, warmth, or redness in your foot.   You are not getting better after 7 days of treatment.   You are not completely cured after 30 days.   You have any problems caused by your medicines.  MAKE SURE YOU:  Understand these instructions.   Will watch your condition.   Will get help right away if you are not doing well or get worse.  Document Released: 01/22/2000 Document Revised: 01/13/2011 Document Reviewed: 11/12/2010 [facility] Patient Information 2012 [place], [place].            Back Pain, Adult Low back pain is very common. About 1 in 5 people have back pain.The cause of low back pain is rarely dangerous. The pain often gets better over time.About half of people with a sudden onset of back pain feel better in just 2 weeks. About 8 in 10 people feel better by 6 weeks.  CAUSES Some common  causes of back pain include:  Strain of the muscles or ligaments supporting the spine.   Wear and tear (degeneration) of the spinal discs.   Arthritis.   Direct injury to the back.  DIAGNOSIS Most of the time, the direct cause of low back pain is not known.However, back pain can be treated effectively even when the exact cause of the pain is unknown.Answering your caregiver's questions about your overall health and symptoms is one of the most accurate ways to make sure the cause of your pain is not dangerous. If your caregiver needs more information, he or she may order lab work or imaging tests (X-rays or MRIs).However, even if imaging tests show changes in your back, this usually does not require surgery. HOME CARE INSTRUCTIONS For many people, back pain returns.Since low back pain is rarely dangerous, it is often a condition that people can learn to [facility] their own.   Remain active. It is stressful on the back to sit or stand in one place. Do not sit, drive, or stand in one place for more than 30 minutes at a time. Take short walks on level surfaces as soon as pain allows.Try to increase the length of time you walk each day.   Do not stay in bed.Resting more than 1 or 2 days can delay your recovery.   Do not avoid exercise or work.Your body is made to move.It is not dangerous to be active, even though your back may hurt.Your back will likely heal faster if you return to being active before your pain is gone.   Pay attention to your body when you bend and lift. Many people have less discomfortwhen lifting if they bend their knees, keep the load close to their bodies,and avoid twisting. Often, the most comfortable positions are those that put less stress on your recovering back.   Find a comfortable position to sleep. Use a firm mattress and lie on your side with your knees slightly bent. If you lie on your back, put a pillow under your knees.   Only take over-the-counter or  prescription medicines as directed by your caregiver. Over-the-counter medicines to reduce pain and inflammation are often the most helpful.Your caregiver may prescribe muscle relaxant drugs.These medicines help dull your pain so you can more quickly return to your normal activities and healthy exercise.   Put ice on the injured area.   Put ice in a plastic bag.   Place a towel between your skin and the bag.   Leave the ice on for 15 to 20 minutes, 3 to 4 times a day for the first 2 to 3 days. After that, ice and heat may be alternated to reduce pain and spasms.   Ask your caregiver about trying back exercises and gentle massage. This may be of some benefit.   Avoid  feeling anxious or stressed.Stress increases muscle tension and can worsen back pain.It is important to recognize when you are anxious or stressed and learn ways to manage it.Exercise is a great option.  SEEK MEDICAL CARE IF:  You have pain that is not relieved with rest or medicine.   You have pain that does not improve in 1 week.   You have new symptoms.   You are generally not feeling well.  SEEK IMMEDIATE MEDICAL CARE IF:   You have pain that radiates from your back into your legs.   You develop new bowel or bladder control problems.   You have unusual weakness or numbness in your arms or legs.   You develop nausea or vomiting.   You develop abdominal pain.   You feel faint.  Document Released: 01/24/2005 Document Revised: 01/13/2011 Document Reviewed: 06/14/2010 [facility] Patient Information 2012 [place], [place].      Back Exercises Back exercises help treat and prevent back injuries. The goal of back exercises is to increase the strength of your abdominal and back muscles and the flexibility of your back. These exercises should be started when you no longer have back pain. Back exercises include:  Pelvic Tilt. Lie on your back with your knees bent. Tilt your pelvis until the lower part of your back  is against the floor. Hold this position 5 to 10 sec and repeat 5 to 10 times.   Knee to Chest. Pull first 1 knee up against your chest and hold for 20 to 30 seconds, repeat this with the other knee, and then both knees. This may be done with the other leg straight or bent, whichever feels better.   Sit-Ups or Curl-Ups. Bend your knees 90 degrees. Start with tilting your pelvis, and do a partial, slow sit-up, lifting your trunk only 30 to 45 degrees off the floor. Take at least 2 to 3 seconds for each sit-up. Do not do sit-ups with your knees out straight. If partial sit-ups are difficult, simply do the above but with only tightening your abdominal muscles and holding it as directed.   Hip-Lift. Lie on your back with your knees flexed 90 degrees. Push down with your feet and shoulders as you raise your hips a couple inches off the floor; hold for 10 seconds, repeat 5 to 10 times.   Back arches. Lie on your stomach, propping yourself up on bent elbows. Slowly press on your hands, causing an arch in your low back. Repeat 3 to 5 times. Any initial stiffness and discomfort should lessen with repetition over time.   Shoulder-Lifts. Lie face down with arms beside your body. Keep hips and torso pressed to floor as you slowly lift your head and shoulders off the floor.  Do not overdo your exercises, especially in the beginning. Exercises may cause you some mild back discomfort which lasts for a few minutes; however, if the pain is more severe, or lasts for more than 15 minutes, do not continue exercises until you see your caregiver. Improvement with exercise therapy for back problems is slow.  See your caregivers for assistance with developing a proper back exercise program. Document Released: 03/03/2004 Document Revised: 01/13/2011 Document Reviewed: 01/24/2005 [facility] Patient Information 2012 [place], [place].
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
## 2011-08-05 NOTE — ED Provider Notes (Signed)
Medical screening examination/treatment/procedure(s) were performed by non-physician practitioner and as supervising physician I was immediately available for consultation/collaboration.  Airianna Kreischer R. Zoanne Newill, MD 08/05/11 0654 

## 2011-08-07 ENCOUNTER — Emergency Department (HOSPITAL_COMMUNITY)
Admission: EM | Admit: 2011-08-07 | Discharge: 2011-08-07 | Disposition: A | Discharge status: Home / Self Care | Payer: Self-pay | Attending: Emergency Medicine | Admitting: Emergency Medicine

## 2011-08-07 ENCOUNTER — Encounter (HOSPITAL_COMMUNITY): Payer: Self-pay | Admitting: Emergency Medicine

## 2011-08-07 DIAGNOSIS — M545 Low back pain, unspecified: Secondary | ICD-10-CM | POA: Insufficient documentation

## 2011-08-07 DIAGNOSIS — G8929 Other chronic pain: Secondary | ICD-10-CM | POA: Insufficient documentation

## 2011-08-07 DIAGNOSIS — I10 Essential (primary) hypertension: Secondary | ICD-10-CM | POA: Insufficient documentation

## 2011-08-07 DIAGNOSIS — Z8673 Personal history of transient ischemic attack (TIA), and cerebral infarction without residual deficits: Secondary | ICD-10-CM | POA: Insufficient documentation

## 2011-08-07 DIAGNOSIS — F17200 Nicotine dependence, unspecified, uncomplicated: Secondary | ICD-10-CM | POA: Insufficient documentation

## 2011-08-07 MED ORDER — OXYCODONE-ACETAMINOPHEN 5-325 MG PO TABS: 1.0000 | ORAL_TABLET | Freq: Four times a day (QID) | ORAL | Status: AC | PRN

## 2011-08-07 MED ORDER — OXYCODONE-ACETAMINOPHEN 5-325 MG PO TABS
2.0000 | ORAL_TABLET | Freq: Once | ORAL | Status: AC
  Administered 2011-08-07: 2 via ORAL
  Filled 2011-08-07: qty 2

## 2011-08-07 NOTE — Discharge Instructions (Signed)
Chronic Back Pain When back pain lasts longer than 3 months, it is called chronic back pain.This pain can be frustrating, but the cause of the pain is rarely dangerous.People with chronic back pain often go through certain periods that are more intense (flare-ups). CAUSES Chronic back pain can be caused by wear and tear (degeneration) on different structures in your back. These structures may include bones, ligaments, or discs. This degeneration may result in more pressure being placed on the nerves that travel to your legs and feet. This can lead to pain traveling from the low back down the back of the legs. When pain lasts longer than 3 months, it is not unusual for people to experience anxiety or depression. Anxiety and depression can also contribute to low back pain. TREATMENT  Establish a regular exercise plan. This is critical to improving your functional level.   Have a self-management plan for when you flare-up. Flare-ups rarely require a medical visit. Regular exercise will help reduce the intensity and frequency of your flare-ups.   Manage how you feel about your back pain and the rest of your life. Anxiety, depression, and feeling that you cannot alter your back pain have been shown to make back pain more intense and debilitating.   Medicines should never be your only treatment. They should be used along with other treatments to help you return to a more active lifestyle.   Procedures such as injections or surgery may be helpful but are rarely necessary. You may be able to get the same results with physical therapy or chiropractic care.  HOME CARE INSTRUCTIONS  Avoid bending, heavy lifting, prolonged sitting, and activities which make the problem worse.   Continue normal activity as much as possible.   Take brief periods of rest throughout the day to reduce your pain during flare-ups.   Follow your back exercise rehabilitation program. This can help reduce symptoms and prevent  more pain.   Only take over-the-counter or prescription medicines as directed by your caregiver. Muscle relaxants are sometimes prescribed. Narcotic pain medicine is discouraged for long-term pain, since addiction is a possible outcome.   If you smoke, quit.   Eat healthy foods and maintain a recommended body weight.  SEEK IMMEDIATE MEDICAL CARE IF:   You have weakness or numbness in one of your legs or feet.   You have trouble controlling your bladder or bowels.   You develop nausea, vomiting, abdominal pain, shortness of breath, or fainting.  Document Released: 03/03/2004 Document Revised: 01/13/2011 Document Reviewed: 01/08/2011 [facility] Patient Information 2012 [place], [place].Back Pain, Adult Low back pain is very common. About 1 in 5 people have back pain.The cause of low back pain is rarely dangerous. The pain often gets better over time.About half of people with a sudden onset of back pain feel better in just 2 weeks. About 8 in 10 people feel better by 6 weeks.  CAUSES Some common causes of back pain include:  Strain of the muscles or ligaments supporting the spine.   Wear and tear (degeneration) of the spinal discs.   Arthritis.   Direct injury to the back.  DIAGNOSIS Most of the time, the direct cause of low back pain is not known.However, back pain can be treated effectively even when the exact cause of the pain is unknown.Answering your caregiver's questions about your overall health and symptoms is one of the most accurate ways to make sure the cause of your pain is not dangerous. If your caregiver needs more information,  he or she may order lab work or imaging tests (X-rays or MRIs).However, even if imaging tests show changes in your back, this usually does not require surgery. HOME CARE INSTRUCTIONS For many people, back pain returns.Since low back pain is rarely dangerous, it is often a condition that people can learn to  [facility] their own.   Remain active. It  is stressful on the back to sit or stand in one place. Do not sit, drive, or stand in one place for more than 30 minutes at a time. Take short walks on level surfaces as soon as pain allows.Try to increase the length of time you walk each day.   Do not stay in bed.Resting more than 1 or 2 days can delay your recovery.   Do not avoid exercise or work.Your body is made to move.It is not dangerous to be active, even though your back may hurt.Your back will likely heal faster if you return to being active before your pain is gone.   Pay attention to your body when you bend and lift. Many people have less discomfortwhen lifting if they bend their knees, keep the load close to their bodies,and avoid twisting. Often, the most comfortable positions are those that put less stress on your recovering back.   Find a comfortable position to sleep. Use a firm mattress and lie on your side with your knees slightly bent. If you lie on your back, put a pillow under your knees.   Only take over-the-counter or prescription medicines as directed by your caregiver. Over-the-counter medicines to reduce pain and inflammation are often the most helpful.Your caregiver may prescribe muscle relaxant drugs.These medicines help dull your pain so you can more quickly return to your normal activities and healthy exercise.   Put ice on the injured area.   Put ice in a plastic bag.   Place a towel between your skin and the bag.   Leave the ice on for 15 to 20 minutes, 3 to 4 times a day for the first 2 to 3 days. After that, ice and heat may be alternated to reduce pain and spasms.   Ask your caregiver about trying back exercises and gentle massage. This may be of some benefit.   Avoid feeling anxious or stressed.Stress increases muscle tension and can worsen back pain.It is important to recognize when you are anxious or stressed and learn ways to manage it.Exercise is a great option.  SEEK MEDICAL CARE  IF:  You have pain that is not relieved with rest or medicine.   You have pain that does not improve in 1 week.   You have new symptoms.   You are generally not feeling well.  SEEK IMMEDIATE MEDICAL CARE IF:   You have pain that radiates from your back into your legs.   You develop new bowel or bladder control problems.   You have unusual weakness or numbness in your arms or legs.   You develop nausea or vomiting.   You develop abdominal pain.   You feel faint.  Document Released: 01/24/2005 Document Revised: 01/13/2011 Document Reviewed: 06/14/2010 [facility] Patient Information 2012 [place], [place].
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
## 2011-08-07 NOTE — ED Notes (Signed)
Pt states while walking to his grandmama house had sudden onset severe back pain radiating down R leg. Arrived via EMS

## 2011-08-08 NOTE — ED Provider Notes (Signed)
Medical screening examination/treatment/procedure(s) were performed by non-physician practitioner and as supervising physician I was immediately available for consultation/collaboration.  Garlin Batdorf, MD 08/08/11 0039 

## 2011-08-09 ENCOUNTER — Emergency Department (HOSPITAL_COMMUNITY)
Admission: EM | Admit: 2011-08-09 | Discharge: 2011-08-09 | Disposition: A | Discharge status: Home / Self Care | Payer: Self-pay | Attending: Emergency Medicine | Admitting: Emergency Medicine

## 2011-08-09 ENCOUNTER — Encounter (HOSPITAL_COMMUNITY): Payer: Self-pay | Admitting: *Deleted

## 2011-08-09 DIAGNOSIS — L97509 Non-pressure chronic ulcer of other part of unspecified foot with unspecified severity: Secondary | ICD-10-CM | POA: Insufficient documentation

## 2011-08-09 DIAGNOSIS — M549 Dorsalgia, unspecified: Secondary | ICD-10-CM | POA: Insufficient documentation

## 2011-08-09 DIAGNOSIS — T148XXA Other injury of unspecified body region, initial encounter: Secondary | ICD-10-CM

## 2011-08-09 DIAGNOSIS — F17200 Nicotine dependence, unspecified, uncomplicated: Secondary | ICD-10-CM | POA: Insufficient documentation

## 2011-08-09 DIAGNOSIS — I10 Essential (primary) hypertension: Secondary | ICD-10-CM | POA: Insufficient documentation

## 2011-08-09 DIAGNOSIS — Z8673 Personal history of transient ischemic attack (TIA), and cerebral infarction without residual deficits: Secondary | ICD-10-CM | POA: Insufficient documentation

## 2011-08-09 LAB — GLUCOSE, CAPILLARY: Glucose-Capillary: 100 mg/dL — ABNORMAL HIGH (ref 70–99)

## 2011-08-09 MED ORDER — OXYCODONE-ACETAMINOPHEN 5-325 MG PO TABS
2.0000 | ORAL_TABLET | Freq: Once | ORAL | Status: AC
  Administered 2011-08-09: 2 via ORAL
  Filled 2011-08-09: qty 2

## 2011-08-09 NOTE — Progress Notes (Signed)
Orthopedic Tech Progress Note Patient Details:  Stephen [name] 29-Jul-1965 [id number]  Ortho Devices Type of Ortho Device: Postop boot Ortho Device/Splint Location: right foot Ortho Device/Splint Interventions: Application   Stephen [name] 08/09/2011, 1:36 PM

## 2011-08-09 NOTE — ED Notes (Signed)
Patient states he has severe back pain x weeks.  He also has cuts in both of his feet and he thinks they are infected. Patient reports sores on his feet for 3 to 4 days.  He denies any diff voiding.  Patient states he is a borderline diabetic
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
## 2011-08-09 NOTE — ED Notes (Signed)
Pt provided [place] sandwich and goody bag.

## 2011-08-09 NOTE — ED Notes (Signed)
Called ortho to come apply right foot boot size 10.5

## 2011-10-18 ENCOUNTER — Encounter (HOSPITAL_COMMUNITY): Payer: Self-pay

## 2011-10-18 DIAGNOSIS — Z8673 Personal history of transient ischemic attack (TIA), and cerebral infarction without residual deficits: Secondary | ICD-10-CM | POA: Insufficient documentation

## 2011-10-18 DIAGNOSIS — I10 Essential (primary) hypertension: Secondary | ICD-10-CM | POA: Insufficient documentation

## 2011-10-18 DIAGNOSIS — F17200 Nicotine dependence, unspecified, uncomplicated: Secondary | ICD-10-CM | POA: Insufficient documentation

## 2011-10-18 DIAGNOSIS — E119 Type 2 diabetes mellitus without complications: Secondary | ICD-10-CM | POA: Insufficient documentation

## 2011-10-18 DIAGNOSIS — M549 Dorsalgia, unspecified: Secondary | ICD-10-CM | POA: Insufficient documentation

## 2011-10-18 DIAGNOSIS — I2510 Atherosclerotic heart disease of native coronary artery without angina pectoris: Secondary | ICD-10-CM | POA: Insufficient documentation

## 2011-10-18 NOTE — ED Notes (Signed)
Per EMS pt moving furniture and pulled a muscle, c/o back pain

## 2011-10-19 ENCOUNTER — Emergency Department (HOSPITAL_COMMUNITY)
Admission: EM | Admit: 2011-10-19 | Discharge: 2011-10-19 | Disposition: A | Discharge status: Home / Self Care | Payer: Self-pay | Attending: Emergency Medicine | Admitting: Emergency Medicine

## 2011-10-19 DIAGNOSIS — M549 Dorsalgia, unspecified: Secondary | ICD-10-CM

## 2011-10-19 HISTORY — DX: Atherosclerotic heart disease of native coronary artery without angina pectoris: I25.10

## 2011-10-19 MED ORDER — OXYCODONE-ACETAMINOPHEN 5-325 MG PO TABS
2.0000 | ORAL_TABLET | Freq: Once | ORAL | Status: AC
  Administered 2011-10-19: 2 via ORAL
  Filled 2011-10-19: qty 2

## 2011-10-19 MED ORDER — OXYCODONE-ACETAMINOPHEN 10-325 MG PO TABS: 1.0000 | ORAL_TABLET | ORAL | Status: AC | PRN
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
## 2011-10-19 NOTE — ED Provider Notes (Signed)
Medical screening examination/treatment/procedure(s) were performed by non-physician practitioner and as supervising physician I was immediately available for consultation/collaboration.   Richardean Canal, MD 10/19/11 [phone]

## 2011-10-21 NOTE — ED Provider Notes (Signed)
Medical screening examination/treatment/procedure(s) were performed by non-physician practitioner and as supervising physician I was immediately available for consultation/collaboration.  Olivia Mackie, MD 10/21/11 2101

## 2013-03-02 ENCOUNTER — Encounter (HOSPITAL_COMMUNITY): Payer: Self-pay | Admitting: Emergency Medicine

## 2013-03-02 ENCOUNTER — Emergency Department (HOSPITAL_COMMUNITY)
Admission: EM | Admit: 2013-03-02 | Discharge: 2013-03-02 | Disposition: A | Discharge status: Home / Self Care | Payer: PRIVATE HEALTH INSURANCE | Attending: Emergency Medicine | Admitting: Emergency Medicine

## 2013-03-02 DIAGNOSIS — Z8673 Personal history of transient ischemic attack (TIA), and cerebral infarction without residual deficits: Secondary | ICD-10-CM | POA: Insufficient documentation

## 2013-03-02 DIAGNOSIS — I2510 Atherosclerotic heart disease of native coronary artery without angina pectoris: Secondary | ICD-10-CM | POA: Insufficient documentation

## 2013-03-02 DIAGNOSIS — F17200 Nicotine dependence, unspecified, uncomplicated: Secondary | ICD-10-CM | POA: Insufficient documentation

## 2013-03-02 DIAGNOSIS — Z87828 Personal history of other (healed) physical injury and trauma: Secondary | ICD-10-CM | POA: Insufficient documentation

## 2013-03-02 DIAGNOSIS — E119 Type 2 diabetes mellitus without complications: Secondary | ICD-10-CM | POA: Insufficient documentation

## 2013-03-02 DIAGNOSIS — IMO0002 Reserved for concepts with insufficient information to code with codable children: Secondary | ICD-10-CM | POA: Insufficient documentation

## 2013-03-02 DIAGNOSIS — Z9889 Other specified postprocedural states: Secondary | ICD-10-CM | POA: Insufficient documentation

## 2013-03-02 DIAGNOSIS — IMO0001 Reserved for inherently not codable concepts without codable children: Secondary | ICD-10-CM

## 2013-03-02 DIAGNOSIS — Z79899 Other long term (current) drug therapy: Secondary | ICD-10-CM | POA: Insufficient documentation

## 2013-03-02 DIAGNOSIS — Z7982 Long term (current) use of aspirin: Secondary | ICD-10-CM | POA: Insufficient documentation

## 2013-03-02 DIAGNOSIS — I10 Essential (primary) hypertension: Secondary | ICD-10-CM | POA: Insufficient documentation

## 2013-03-02 MED ORDER — KETOROLAC TROMETHAMINE 60 MG/2ML IM SOLN
60.0000 mg | Freq: Once | INTRAMUSCULAR | Status: AC
  Administered 2013-03-02: 60 mg via INTRAMUSCULAR
  Filled 2013-03-02: qty 2

## 2013-03-02 MED ORDER — OXYCODONE-ACETAMINOPHEN 5-325 MG PO TABS: ORAL_TABLET | ORAL | Status: DC

## 2013-03-02 MED ORDER — IBUPROFEN 600 MG PO TABS: 600.0000 mg | ORAL_TABLET | Freq: Four times a day (QID) | ORAL | Status: DC | PRN

## 2013-03-02 MED ORDER — OXYCODONE-ACETAMINOPHEN 5-325 MG PO TABS
2.0000 | ORAL_TABLET | Freq: Once | ORAL | Status: AC
  Administered 2013-03-02: 2 via ORAL
  Filled 2013-03-02: qty 2

## 2013-03-02 NOTE — Discharge Instructions (Signed)
Back Pain, Adult Back pain is very common. The pain often gets better over time. The cause of back pain is usually not dangerous. Most people can learn to manage their back pain on their own.  HOME CARE   Stay active. Start with short walks on flat ground if you can. Try to walk farther each day.  Do not sit, drive, or stand in one place for more than 30 minutes. Do not stay in bed.  Do not avoid exercise or work. Activity can help your back heal faster.  Be careful when you bend or lift an object. Bend at your knees, keep the object close to you, and do not twist.  Sleep on a firm mattress. Lie on your side, and bend your knees. If you lie on your back, put a pillow under your knees.  Only take medicines as told by your doctor.  Put ice on the injured area.  Put ice in a plastic bag.  Place a towel between your skin and the bag.  Leave the ice on for 15-20 minutes, 03-04 times a day for the first 2 to 3 days. After that, you can switch between ice and heat packs.  Ask your doctor about back exercises or massage.  Avoid feeling anxious or stressed. Find good ways to deal with stress, such as exercise. GET HELP RIGHT AWAY IF:   Your pain does not go away with rest or medicine.  Your pain does not go away in 1 week.  You have new problems.  You do not feel well.  The pain spreads into your legs.  You cannot control when you poop (bowel movement) or pee (urinate).  Your arms or legs feel weak or lose feeling (numbness).  You feel sick to your stomach (nauseous) or throw up (vomit).  You have belly (abdominal) pain.  You feel like you may pass out (faint). MAKE SURE YOU:   Understand these instructions.  Will watch your condition.  Will get help right away if you are not doing well or get worse. Document Released: 07/13/2007 Document Revised: 04/18/2011 Document Reviewed: 06/14/2010 [facility] Patient Information 2014 [place].  Back Exercises Back  exercises help treat and prevent back injuries. The goal is to increase your strength in your belly (abdominal) and back muscles. These exercises can also help with flexibility. Start these exercises when told by your doctor. HOME CARE Back exercises include: Pelvic Tilt.  Lie on your back with your knees bent. Tilt your pelvis until the lower part of your back is against the floor. Hold this position 5 to 10 sec. Repeat this exercise 5 to 10 times. Knee to Chest.  Pull 1 knee up against your chest and hold for 20 to 30 seconds. Repeat this with the other knee. This may be done with the other leg straight or bent, whichever feels better. Then, pull both knees up against your chest. Sit-Ups or Curl-Ups.  Bend your knees 90 degrees. Start with tilting your pelvis, and do a partial, slow sit-up. Only lift your upper half 30 to 45 degrees off the floor. Take at least 2 to 3 seonds for each sit-up. Do not do sit-ups with your knees out straight. If partial sit-ups are difficult, simply do the above but with only tightening your belly (abdominal) muscles and holding it as told. Hip-Lift.  Lie on your back with your knees flexed 90 degrees. Push down with your feet and shoulders as you raise your hips 2 inches off the  floor. Hold for 10 seconds, repeat 5 to 10 times. Back Arches.  Lie on your stomach. Prop yourself up on bent elbows. Slowly press on your hands, causing an arch in your low back. Repeat 3 to 5 times. Shoulder-Lifts.  Lie face down with arms beside your body. Keep hips and belly pressed to floor as you slowly lift your head and shoulders off the floor. Do not overdo your exercises. Be careful in the beginning. Exercises may cause you some mild back discomfort. If the pain lasts for more than 15 minutes, stop the exercises until you see your doctor. Improvement with exercise for back problems is slow.  Document Released: 02/26/2010 Document Revised: 04/18/2011 Document Reviewed:  11/25/2010 [employer] [facility] Patient Information 2014 [place], [place].

## 2013-03-02 NOTE — ED Notes (Signed)
Pt arrived via [facility] c/o thoracic back pain after walking home from work. Pain radiates down RT leg. Same symptoms 1 1/2 year ago.
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
## 2013-03-03 NOTE — ED Provider Notes (Signed)
Medical screening examination/treatment/procedure(s) were performed by non-physician practitioner and as supervising physician I was immediately available for consultation/collaboration.  EKG Interpretation   None         Blanchard Kelch, MD 03/03/13 1230

## 2013-08-08 ENCOUNTER — Emergency Department (HOSPITAL_COMMUNITY)
Admission: EM | Admit: 2013-08-08 | Discharge: 2013-08-08 | Disposition: A | Discharge status: Home / Self Care | Payer: PRIVATE HEALTH INSURANCE | Attending: Emergency Medicine | Admitting: Emergency Medicine

## 2013-08-08 ENCOUNTER — Encounter (HOSPITAL_COMMUNITY): Payer: Self-pay | Admitting: Emergency Medicine

## 2013-08-08 DIAGNOSIS — I10 Essential (primary) hypertension: Secondary | ICD-10-CM | POA: Insufficient documentation

## 2013-08-08 DIAGNOSIS — F17200 Nicotine dependence, unspecified, uncomplicated: Secondary | ICD-10-CM | POA: Insufficient documentation

## 2013-08-08 DIAGNOSIS — I2510 Atherosclerotic heart disease of native coronary artery without angina pectoris: Secondary | ICD-10-CM | POA: Insufficient documentation

## 2013-08-08 DIAGNOSIS — Z87828 Personal history of other (healed) physical injury and trauma: Secondary | ICD-10-CM | POA: Insufficient documentation

## 2013-08-08 DIAGNOSIS — G8929 Other chronic pain: Secondary | ICD-10-CM | POA: Insufficient documentation

## 2013-08-08 DIAGNOSIS — Z79899 Other long term (current) drug therapy: Secondary | ICD-10-CM | POA: Insufficient documentation

## 2013-08-08 DIAGNOSIS — M5430 Sciatica, unspecified side: Secondary | ICD-10-CM | POA: Insufficient documentation

## 2013-08-08 DIAGNOSIS — E119 Type 2 diabetes mellitus without complications: Secondary | ICD-10-CM | POA: Insufficient documentation

## 2013-08-08 DIAGNOSIS — M5440 Lumbago with sciatica, unspecified side: Secondary | ICD-10-CM

## 2013-08-08 DIAGNOSIS — Z8673 Personal history of transient ischemic attack (TIA), and cerebral infarction without residual deficits: Secondary | ICD-10-CM | POA: Insufficient documentation

## 2013-08-08 MED ORDER — OXYCODONE-ACETAMINOPHEN 5-325 MG PO TABS
2.0000 | ORAL_TABLET | Freq: Once | ORAL | Status: AC
  Administered 2013-08-08: 2 via ORAL
  Filled 2013-08-08: qty 2

## 2013-08-08 MED ORDER — ONDANSETRON 4 MG PO TBDP
8.0000 mg | ORAL_TABLET | Freq: Once | ORAL | Status: AC
  Administered 2013-08-08: 8 mg via ORAL
  Filled 2013-08-08: qty 2

## 2013-08-08 MED ORDER — DIAZEPAM 5 MG PO TABS: 5.0000 mg | ORAL_TABLET | Freq: Four times a day (QID) | ORAL | Status: DC | PRN

## 2013-08-08 MED ORDER — DIAZEPAM 5 MG PO TABS
10.0000 mg | ORAL_TABLET | Freq: Once | ORAL | Status: AC
  Administered 2013-08-08: 10 mg via ORAL
  Filled 2013-08-08: qty 2

## 2013-08-08 MED ORDER — OXYCODONE-ACETAMINOPHEN 5-325 MG PO TABS: 1.0000 | ORAL_TABLET | ORAL | Status: DC | PRN

## 2013-08-08 NOTE — ED Notes (Signed)
Pt to ED for evaluation of lower back pain that radiates down bilateral legs, pt reports sharp pains occuring several times today.  Pt has hx of back pain.  Around 10pm pt took one of his neighbors Percocet, pain currently 8/10.  Denies changes in bowel or bladder.  Pt ambulatory from triage.
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
## 2013-08-08 NOTE — Discharge Instructions (Signed)
Back Pain, Adult Back pain is very common. The pain often gets better over time. The cause of back pain is usually not dangerous. Most people can learn to manage their back pain on their own.  HOME CARE   Stay active. Start with short walks on flat ground if you can. Try to walk farther each day.  Do not sit, drive, or stand in one place for more than 30 minutes. Do not stay in bed.  Do not avoid exercise or work. Activity can help your back heal faster.  Be careful when you bend or lift an object. Bend at your knees, keep the object close to you, and do not twist.  Sleep on a firm mattress. Lie on your side, and bend your knees. If you lie on your back, put a pillow under your knees.  Only take medicines as told by your doctor.  Put ice on the injured area.  Put ice in a plastic bag.  Place a towel between your skin and the bag.  Leave the ice on for 15-20 minutes, 03-04 times a day for the first 2 to 3 days. After that, you can switch between ice and heat packs.  Ask your doctor about back exercises or massage.  Avoid feeling anxious or stressed. Find good ways to deal with stress, such as exercise. GET HELP RIGHT AWAY IF:   Your pain does not go away with rest or medicine.  Your pain does not go away in 1 week.  You have new problems.  You do not feel well.  The pain spreads into your legs.  You cannot control when you poop (bowel movement) or pee (urinate).  Your arms or legs feel weak or lose feeling (numbness).  You feel sick to your stomach (nauseous) or throw up (vomit).  You have belly (abdominal) pain.  You feel like you may pass out (faint). MAKE SURE YOU:   Understand these instructions.  Will watch your condition.  Will get help right away if you are not doing well or get worse. Document Released: 07/13/2007 Document Revised: 04/18/2011 Document Reviewed: 06/14/2010 [facility] Patient Information 2015 [place], [place]. This information is not intended  to replace advice given to you by your health care provider. Make sure you discuss any questions you have with your health care provider.   Emergency Department Resource Guide 1) Find a Doctor and Pay Out of Pocket Although you won't have to find out who is covered by your insurance plan, it is a good idea to ask around and get recommendations. You will then need to call the office and see if the doctor you have chosen will accept you as a new patient and what types of options they offer for patients who are self-pay. Some doctors offer discounts or will set up payment plans for their patients who do not have insurance, but you will need to ask so you aren't surprised when you get to your appointment.  2) Contact Your Local Health Department Not all health departments have doctors that can see patients for sick visits, but many do, so it is worth a call to see if yours does. If you don't know where your local health department is, you can check in your phone book. The CDC also has a tool to help you locate your state's health department, and many state websites also have listings of all of their local health departments.  3) Find a [facility] If your illness is not likely to be  very severe or complicated, you may want to try a walk in clinic. These are popping up all over the country in pharmacies, drugstores, and shopping centers. They're usually staffed by nurse practitioners or physician assistants that have been trained to treat common illnesses and complaints. They're usually fairly quick and inexpensive. However, if you have serious medical issues or chronic medical problems, these are probably not your best option. ° °No Primary Care Doctor: °- Call [facility] Connect at  832-8000 - they can help you locate a primary care doctor that  accepts your insurance, provides certain services, etc. °- Physician Referral Service- 1-800-533-3463 ° °Chronic Pain Problems: °Organization         Address  Phone    Notes  °[facility]  (336) 297-2271 Patients need to be referred by their primary care doctor.  ° °Medication Assistance: °Organization         Address  Phone   Notes  °[place] [facility] [address] °[place], [place] [address] (336) 641-8030 --Must be a resident of [place] °-- Must have NO insurance coverage whatsoever (no Medicaid/ Medicare, etc.) °-- The pt. MUST have a primary care doctor that directs their care regularly and follows them in the community °  °[facility]  (866) 331-1348   °[employer]  (888) 892-1162   ° °Agencies that provide inexpensive medical care: °Organization         Address  Phone   Notes  °[facility]  (336) 832-8035   °[facility]    (336) 832-7272   °[facility] [address] °[place], [place] [address] (336) 832-4777   °[facility] [address], °[place] (336) 271-4999   °[facility]    (336) 373-0678   °[facility]    (336) 272-1050   °[facility] ° [address], [place] Phone:  (336) 832-4444, Fax:  (336) 832-4440 Hours of Operation:  9 am - 6 pm, M-F.  Also accepts Medicaid/Medicare and self-pay.  °[facility] ° [address], [place] Phone: (336) 832-3150, Fax: (336) 832-3151. Hours of Operation:  8:30 am - 5:30 pm, M-F.  Also accepts Medicaid and self-pay.  °[facility] [place] [address], [place] Phone: (336) 878-6027   °[facility] [address], [place], [place] (336)723-1848, Ext. 123 Mondays & Thursdays: 7-9 AM.  First 15 patients are seen on a first come, first serve basis. °  ° °Medicaid-accepting [place] Providers: ° °Organization         Address  Phone   Notes  °[facility] [address], [place] (336) 641-2100 Also accepts self-pay patients.  °[facility]  [address], [place] ° (336) 856-9996   °[facility] [address], [place] (336) 288-8857   °[facility] [address] [place] [address], [place] (336) 299-7000   °Veita Bland [address],   ° (336) 373-1557 Only accepts [place] Access Medicaid patients after they have their name applied to their card.  ° °Self-Pay (no insurance) in [place]: ° °Organization         Address  Phone   Notes  °Sickle Cell Patients, [facility] [address],  (336) 832-1970   °[facility]   [address], [place] (336) 832-4400   °[facility] ° [address], [place] (336) 992-4800   °[facility]/Dr. Osei-Bonsu ° [address] [place] [address], [place] or [address], [place] (336) 841-8500 Phone number for both [place] and [place] locations is the same.  °[facility] [address], [place] (336) 299-0000   °[facility] [address] [place] [address], [place] or [address] (336) 852-7530 °(336) 878-2260   °[facility] [address], [place] (336) 350-1642, phone; (336) 294-5005, fax Sees patients 1st and 3rd Saturday of every month.  Must not qualify for public or private insurance (i.e. Medicaid, Medicare, [place] Health Choice, Veterans' Benefits) • Household income should be no more than 200% of the poverty level •The clinic cannot treat you if you are pregnant or think you are pregnant • Sexually transmitted diseases are not treated at the clinic.  ° ° °Dental Care: °Organization         Address  Phone  Notes  °[facility] [address], [place] (336) 641-6152 Accepts children up to age 21 who are enrolled in Medicaid or [place] Health Choice; pregnant women with a Medicaid card; and children who have  applied for Medicaid or [place] Health Choice, but were declined, whose parents can pay a reduced fee at time of service.  °[facility] [place]  [address], [place] (336) 641-7733 Accepts children up to age 21 who are enrolled in Medicaid or [place] Health Choice; pregnant women with a Medicaid card; and children who have applied for Medicaid or [place] Health Choice, but were declined, whose parents can pay a reduced fee at time of service.  °[facility] ° [address], [place] (336) 641-4533 Patients are seen by appointment only. Walk-ins are not accepted. [facility] will see patients 18 years of age and older. °Monday - Tuesday (8am-5pm) °Most Wednesdays (8:30-5pm) °$30 per visit, cash only  °[facility] ° [address], [place] (336) 641-4533 Patients are seen by appointment only. Walk-ins are not accepted. [facility] will see patients 18 years of age and older. °One Wednesday Evening (Monthly: Volunteer Based).  $30 per visit, cash only  °[facility]  (919) 537-3737 for adults; Children under age 4, call Graduate Pediatric Dentistry at (919) [phone]. Children aged 4-14, please call (919) [phone] to request a pediatric application. ° Dental services are provided in all areas of dental care including fillings, crowns and bridges, complete and partial dentures, implants, gum treatment, root canals, and extractions. Preventive care is also provided. Treatment is provided to both adults and children. °Patients are selected via a lottery and there is often a waiting list. °  °[facility] [address], °[place] ° (336) 763-8833 [email or web address] °  °[facility] [address], [place],  (336)723-1848, Ext. 123 Second and Fourth Thursday of each month, opens at 6:30 AM; Clinic ends at 9 AM.  Patients are seen on a first-come first-served basis, and a  limited number are seen during each clinic.  ° °[facility] ° [address], [place],  (336) 723-7904   Eligibility Requirements °You must have lived in [place], [place], or [place] counties for at least the last three months. °    You cannot be eligible for state or federal sponsored healthcare insurance, including Veterans Administration, Medicaid, or Medicare. °  You generally cannot be eligible for healthcare insurance through your employer.  °  How to apply: °Eligibility screenings are held every Tuesday and Wednesday afternoon from 1:00 pm until 4:00 pm. You do not need an appointment for the interview!  °[facility] [address], [place], [place] 336-631-2330   °[facility]  336-342-8273   °[facility]  336-703-3100   °[facility]  336-570-6415   ° °Behavioral Health Resources in the Community: °Intensive Outpatient Programs °Organization         Address  Phone  Notes  °[facility] [address], [place], [place] 336-878-6098   °[facility] [address], [place], [place] 336-832-9800   °[facility] [address], [place], [place] ° 336-882-2125   °[facility] [address],  °[place], [place] 1-800-853-5163 or 336-641-4981   °Substance Abuse Resources °Organization         Address  Phone  Notes  °[facility]  336-882-2125   °[facility]  336-784-9470   °[facility]  336-285-9073   °[facility]  336-845-3988   °Residential & Outpatient Substance Abuse Program  1-800-659-3381   °Psychological Services °Organization         Address  Phone  Notes  °[facility]  336- 832-9600   °[facility]  336- 378-7881   °[facility] [address], [place] 1-800-853-5163 or 336-641-4981   ° °Mobile Crisis Teams °Organization          Address  Phone  Notes  °[facility], Mobile Crisis Care Unit  1-877-626-1772   °Assertive °[facility] ° [address] [place], [place] 336-834-9664   °Sharon DeEsch [address] °[place] 336-554-5454   ° °Self-Help/Support Groups °Organization         Address  Phone             Notes  °[facility] [facility] - variety of support groups  336- 373-1402 Call for more information  °Narcotics Anonymous (NA), [facility] [address], °[place]  2 meetings at this location  ° °Residential Treatment Programs °Organization         Address  Phone  Notes  °[facility] [address],    °[place]  1-866-801-8205   °[facility] ° [address], [place], [place] 704-293-8524   °[facility] [address], [place] 336-845-3988 Admissions: 8am-3pm M-F  °[facility] [address]    °[place], [place] 336-841-1104   °[facility] [address], [place], [place] 336-379-7146   °[facility] [address]  °[place], [place] 336-285-9073   °[facility] Intensive Outpatient [address], [place], [place] 336-852-3033   °[facility] [address]  °[place], [place] 1-877-615-2722 or 336-784-9470   °[facility] [address] [place], [place] 336-227-7417 Accepts Medicaid  °[facility] [address]  °[place] 1-800-659-3381 Substance Abuse/Addiction Treatment  ° °[place] Behavioral Health Resources °Organization         Address  Phone  Notes  °[facility]  (888) 581-9988   °Julie Brannon, PhD [address],   [place], [place]   567 034 3997 or 401-518-3813   [facility]   [address] [place], [place] (661)215-8106   [address], [place], [place] 573-100-5610 Insurance/Medicaid/sponsorship  through [facility] [address]                                    [place], [place] [phone] [place], [place] [phone]    Dr. Adele Schilder  [phone]   [facility] 1) [address], [place] 2) [place] 3)  [address], [place] ([phone]  [phone]   [place] [phone] or [phone] (After Hours)

## 2013-08-08 NOTE — ED Provider Notes (Signed)
Medical screening examination/treatment/procedure(s) were performed by non-physician practitioner and as supervising physician I was immediately available for consultation/collaboration.   EKG Interpretation None        Elyn Peers, MD 08/08/13 [phone]

## 2013-08-24 ENCOUNTER — Emergency Department (HOSPITAL_COMMUNITY)
Admission: EM | Admit: 2013-08-24 | Discharge: 2013-08-24 | Disposition: A | Discharge status: Home / Self Care | Payer: PRIVATE HEALTH INSURANCE | Attending: Emergency Medicine | Admitting: Emergency Medicine

## 2013-08-24 ENCOUNTER — Emergency Department (HOSPITAL_COMMUNITY): Payer: PRIVATE HEALTH INSURANCE

## 2013-08-24 ENCOUNTER — Encounter (HOSPITAL_COMMUNITY): Payer: Self-pay | Admitting: Emergency Medicine

## 2013-08-24 DIAGNOSIS — X500XXA Overexertion from strenuous movement or load, initial encounter: Secondary | ICD-10-CM | POA: Insufficient documentation

## 2013-08-24 DIAGNOSIS — E119 Type 2 diabetes mellitus without complications: Secondary | ICD-10-CM | POA: Insufficient documentation

## 2013-08-24 DIAGNOSIS — Z87828 Personal history of other (healed) physical injury and trauma: Secondary | ICD-10-CM | POA: Insufficient documentation

## 2013-08-24 DIAGNOSIS — Z8673 Personal history of transient ischemic attack (TIA), and cerebral infarction without residual deficits: Secondary | ICD-10-CM | POA: Insufficient documentation

## 2013-08-24 DIAGNOSIS — M545 Low back pain, unspecified: Secondary | ICD-10-CM | POA: Insufficient documentation

## 2013-08-24 DIAGNOSIS — G8929 Other chronic pain: Secondary | ICD-10-CM | POA: Insufficient documentation

## 2013-08-24 DIAGNOSIS — I10 Essential (primary) hypertension: Secondary | ICD-10-CM | POA: Insufficient documentation

## 2013-08-24 DIAGNOSIS — Y9389 Activity, other specified: Secondary | ICD-10-CM | POA: Insufficient documentation

## 2013-08-24 DIAGNOSIS — S93401A Sprain of unspecified ligament of right ankle, initial encounter: Secondary | ICD-10-CM

## 2013-08-24 DIAGNOSIS — Y9289 Other specified places as the place of occurrence of the external cause: Secondary | ICD-10-CM | POA: Insufficient documentation

## 2013-08-24 DIAGNOSIS — F17200 Nicotine dependence, unspecified, uncomplicated: Secondary | ICD-10-CM | POA: Insufficient documentation

## 2013-08-24 DIAGNOSIS — S93409A Sprain of unspecified ligament of unspecified ankle, initial encounter: Secondary | ICD-10-CM | POA: Insufficient documentation

## 2013-08-24 DIAGNOSIS — I2510 Atherosclerotic heart disease of native coronary artery without angina pectoris: Secondary | ICD-10-CM | POA: Insufficient documentation

## 2013-08-24 DIAGNOSIS — S9000XA Contusion of unspecified ankle, initial encounter: Secondary | ICD-10-CM | POA: Insufficient documentation

## 2013-08-24 DIAGNOSIS — Z79899 Other long term (current) drug therapy: Secondary | ICD-10-CM | POA: Insufficient documentation

## 2013-08-24 LAB — CBC
HCT: 38.7 % — ABNORMAL LOW (ref 39.0–52.0)
Hemoglobin: 13.1 g/dL (ref 13.0–17.0)
MCH: 31 pg (ref 26.0–34.0)
MCHC: 33.9 g/dL (ref 30.0–36.0)
MCV: 91.7 fL (ref 78.0–100.0)
Platelets: 140 10*3/uL — ABNORMAL LOW (ref 150–400)
RBC: 4.22 MIL/uL (ref 4.22–5.81)
RDW: 13.1 % (ref 11.5–15.5)
WBC: 5.4 10*3/uL (ref 4.0–10.5)

## 2013-08-24 LAB — BASIC METABOLIC PANEL
Anion gap: 10 (ref 5–15)
BUN: 12 mg/dL (ref 6–23)
CO2: 29 mEq/L (ref 19–32)
Calcium: 8.7 mg/dL (ref 8.4–10.5)
Chloride: 102 mEq/L (ref 96–112)
Creatinine, Ser: 1.04 mg/dL (ref 0.50–1.35)
GFR calc Af Amer: >90 mL/min (ref >90)
GFR calc non Af Amer: 83 mL/min — ABNORMAL LOW (ref >90)
Glucose, Bld: 79 mg/dL (ref 70–99)
Potassium: 4.3 mEq/L (ref 3.7–5.3)
Sodium: 141 mEq/L (ref 137–147)

## 2013-08-24 MED ORDER — MORPHINE SULFATE 4 MG/ML IJ SOLN
4.0000 mg | Freq: Once | INTRAMUSCULAR | Status: AC
  Administered 2013-08-24: 4 mg via INTRAVENOUS
  Filled 2013-08-24: qty 1

## 2013-08-24 MED ORDER — OXYCODONE-ACETAMINOPHEN 5-325 MG PO TABS: 1.0000 | ORAL_TABLET | Freq: Four times a day (QID) | ORAL | Status: DC | PRN

## 2013-08-24 MED ORDER — MELOXICAM 7.5 MG PO TABS: 15.0000 mg | ORAL_TABLET | Freq: Every day | ORAL | Status: DC

## 2013-08-24 MED ORDER — OXYCODONE-ACETAMINOPHEN 5-325 MG PO TABS
2.0000 | ORAL_TABLET | Freq: Once | ORAL | Status: AC
  Administered 2013-08-24: 2 via ORAL
  Filled 2013-08-24: qty 2

## 2013-08-24 NOTE — ED Notes (Signed)
Pt states pain level is 5. He states that he feels a little comfortable now but pain is still present.

## 2013-08-24 NOTE — Discharge Instructions (Signed)

## 2013-08-24 NOTE — ED Notes (Addendum)
C/o pain to right lower leg from ankle to mid-calf after twisting leg has chronic weak back/pain and tried to prevent falling and ended uop twisting left leg

## 2013-08-24 NOTE — Progress Notes (Signed)
Orthopedic Tech Progress Note Patient Details:  Stephen [name] [birth year] [id number] [employer] Type of Ortho Device: Ace wrap;Crutches Ortho Device/Splint Interventions: Application   [name] 08/24/2013, 1:41 PM
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
## 2013-08-25 NOTE — ED Provider Notes (Signed)
Medical screening examination/treatment/procedure(s) were performed by non-physician practitioner and as supervising physician I was immediately available for consultation/collaboration.   EKG Interpretation None       Stephen [name]. Alvino Chapel, MD 08/25/13 [phone]

## 2014-08-09 ENCOUNTER — Emergency Department (HOSPITAL_COMMUNITY)
Admission: EM | Admit: 2014-08-09 | Discharge: 2014-08-09 | Disposition: A | Discharge status: Home / Self Care | Payer: PRIVATE HEALTH INSURANCE | Attending: Emergency Medicine | Admitting: Emergency Medicine

## 2014-08-09 ENCOUNTER — Encounter (HOSPITAL_COMMUNITY): Payer: Self-pay

## 2014-08-09 DIAGNOSIS — Z720 Tobacco use: Secondary | ICD-10-CM | POA: Insufficient documentation

## 2014-08-09 DIAGNOSIS — Z9889 Other specified postprocedural states: Secondary | ICD-10-CM | POA: Insufficient documentation

## 2014-08-09 DIAGNOSIS — Z8673 Personal history of transient ischemic attack (TIA), and cerebral infarction without residual deficits: Secondary | ICD-10-CM | POA: Insufficient documentation

## 2014-08-09 DIAGNOSIS — M545 Low back pain, unspecified: Secondary | ICD-10-CM

## 2014-08-09 DIAGNOSIS — Z79899 Other long term (current) drug therapy: Secondary | ICD-10-CM | POA: Insufficient documentation

## 2014-08-09 DIAGNOSIS — Z87828 Personal history of other (healed) physical injury and trauma: Secondary | ICD-10-CM | POA: Insufficient documentation

## 2014-08-09 DIAGNOSIS — Z791 Long term (current) use of non-steroidal anti-inflammatories (NSAID): Secondary | ICD-10-CM | POA: Insufficient documentation

## 2014-08-09 DIAGNOSIS — E119 Type 2 diabetes mellitus without complications: Secondary | ICD-10-CM | POA: Insufficient documentation

## 2014-08-09 DIAGNOSIS — I2510 Atherosclerotic heart disease of native coronary artery without angina pectoris: Secondary | ICD-10-CM | POA: Insufficient documentation

## 2014-08-09 DIAGNOSIS — I10 Essential (primary) hypertension: Secondary | ICD-10-CM | POA: Insufficient documentation

## 2014-08-09 MED ORDER — CYCLOBENZAPRINE HCL 10 MG PO TABS
10.0000 mg | ORAL_TABLET | Freq: Once | ORAL | Status: AC
  Administered 2014-08-09: 10 mg via ORAL
  Filled 2014-08-09: qty 1

## 2014-08-09 MED ORDER — CYCLOBENZAPRINE HCL 10 MG PO TABS: 10.0000 mg | ORAL_TABLET | Freq: Two times a day (BID) | ORAL | Status: DC | PRN

## 2014-08-09 MED ORDER — IBUPROFEN 800 MG PO TABS
800.0000 mg | ORAL_TABLET | Freq: Once | ORAL | Status: AC
  Administered 2014-08-09: 800 mg via ORAL
  Filled 2014-08-09: qty 1

## 2014-08-09 MED ORDER — IBUPROFEN 800 MG PO TABS: 800.0000 mg | ORAL_TABLET | Freq: Three times a day (TID) | ORAL | Status: DC

## 2014-08-09 NOTE — Discharge Instructions (Signed)
Back Exercises Back exercises help treat and prevent back injuries. The goal of back exercises is to increase the strength of your abdominal and back muscles and the flexibility of your back. These exercises should be started when you no longer have back pain. Back exercises include:  Pelvic Tilt. Lie on your back with your knees bent. Tilt your pelvis until the lower part of your back is against the floor. Hold this position 5 to 10 sec and repeat 5 to 10 times.  Knee to Chest. Pull first 1 knee up against your chest and hold for 20 to 30 seconds, repeat this with the other knee, and then both knees. This may be done with the other leg straight or bent, whichever feels better.  Sit-Ups or Curl-Ups. Bend your knees 90 degrees. Start with tilting your pelvis, and do a partial, slow sit-up, lifting your trunk only 30 to 45 degrees off the floor. Take at least 2 to 3 seconds for each sit-up. Do not do sit-ups with your knees out straight. If partial sit-ups are difficult, simply do the above but with only tightening your abdominal muscles and holding it as directed.  Hip-Lift. Lie on your back with your knees flexed 90 degrees. Push down with your feet and shoulders as you raise your hips a couple inches off the floor; hold for 10 seconds, repeat 5 to 10 times.  Back arches. Lie on your stomach, propping yourself up on bent elbows. Slowly press on your hands, causing an arch in your low back. Repeat 3 to 5 times. Any initial stiffness and discomfort should lessen with repetition over time.  Shoulder-Lifts. Lie face down with arms beside your body. Keep hips and torso pressed to floor as you slowly lift your head and shoulders off the floor. Do not overdo your exercises, especially in the beginning. Exercises may cause you some mild back discomfort which lasts for a few minutes; however, if the pain is more severe, or lasts for more than 15 minutes, do not continue exercises until you see your caregiver.  Improvement with exercise therapy for back problems is slow.  See your caregivers for assistance with developing a proper back exercise program. Document Released: 03/03/2004 Document Revised: 04/18/2011 Document Reviewed: 11/25/2010 [facility] Patient Information 2015 [place], [place]. This information is not intended to replace advice given to you by your health care provider. Make sure you discuss any questions you have with your health care provider.  Back Injury Prevention Back injuries can be extremely painful and difficult to heal. After having one back injury, you are much more likely to experience another later on. It is important to learn how to avoid injuring or re-injuring your back. The following tips can help you to prevent a back injury. PHYSICAL FITNESS  Exercise regularly and try to develop good tone in your abdominal muscles. Your abdominal muscles provide a lot of the support needed by your back.  Do aerobic exercises (walking, jogging, biking, swimming) regularly.  Do exercises that increase balance and strength (tai chi, yoga) regularly. This can decrease your risk of falling and injuring your back.  Stretch before and after exercising.  Maintain a healthy weight. The more you weigh, the more stress is placed on your back. For every pound of weight, 10 times that amount of pressure is placed on the back. DIET  Talk to your caregiver about how much calcium and vitamin D you need per day. These nutrients help to prevent weakening of the bones (osteoporosis). Osteoporosis  can cause broken (fractured) bones that lead to back pain.  Include good sources of calcium in your diet, such as dairy products, green, leafy vegetables, and products with calcium added (fortified).  Include good sources of vitamin D in your diet, such as milk and foods that are fortified with vitamin D.  Consider taking a nutritional supplement or a multivitamin if needed.  Stop smoking if you  smoke. POSTURE  Sit and stand up straight. Avoid leaning forward when you sit or hunching over when you stand.  Choose chairs with good low back (lumbar) support.  If you work at a desk, sit close to your work so you do not need to lean over. Keep your chin tucked in. Keep your neck drawn back and elbows bent at a right angle. Your arms should look like the letter "L."  Sit high and close to the steering wheel when you drive. Add a lumbar support to your car seat if needed.  Avoid sitting or standing in one position for too long. Take breaks to get up, stretch, and walk around at least once every hour. Take breaks if you are driving for long periods of time.  Sleep on your side with your knees slightly bent, or sleep on your back with a pillow under your knees. Do not sleep on your stomach. LIFTING, TWISTING, AND REACHING  Avoid heavy lifting, especially repetitive lifting. If you must do heavy lifting:  Stretch before lifting.  Work slowly.  Rest between lifts.  Use carts and dollies to move objects when possible.  Make several small trips instead of carrying 1 heavy load.  Ask for help when you need it.  Ask for help when moving big, awkward objects.  Follow these steps when lifting:  Stand with your feet shoulder-width apart.  Get as close to the object as you can. Do not try to pick up heavy objects that are far from your body.  Use handles or lifting straps if they are available.  Bend at your knees. Squat down, but keep your heels off the floor.  Keep your shoulders pulled back, your chin tucked in, and your back straight.  Lift the object slowly, tightening the muscles in your legs, abdomen, and buttocks. Keep the object as close to the center of your body as possible.  When you put a load down, use these same guidelines in reverse.  Do not:  Lift the object above your waist.  Twist at the waist while lifting or carrying a load. Move your feet if you need to  turn, not your waist.  Bend over without bending at your knees.  Avoid reaching over your head, across a table, or for an object on a high surface. OTHER TIPS  Avoid wet floors and keep sidewalks clear of ice to prevent falls.  Do not sleep on a mattress that is too soft or too hard.  Keep items that are used frequently within easy reach.  Put heavier objects on shelves at waist level and lighter objects on lower or higher shelves.  Find ways to decrease your stress, such as exercise, massage, or relaxation techniques. Stress can build up in your muscles. Tense muscles are more vulnerable to injury.  Seek treatment for depression or anxiety if needed. These conditions can increase your risk of developing back pain. SEEK MEDICAL CARE IF:  You injure your back.  You have questions about diet, exercise, or other ways to prevent back injuries. MAKE SURE YOU:  Understand these  instructions.  Will watch your condition.  Will get help right away if you are not doing well or get worse. Document Released: 03/03/2004 Document Revised: 04/18/2011 Document Reviewed: 03/07/2011 [facility] Patient Information 2015 [place], [place]. This information is not intended to replace advice given to you by your health care provider. Make sure you discuss any questions you have with your health care provider. Cryotherapy Cryotherapy means treatment with cold. Ice or gel packs can be used to reduce both pain and swelling. Ice is the most helpful within the first 24 to 48 hours after an injury or flare-up from overusing a muscle or joint. Sprains, strains, spasms, burning pain, shooting pain, and aches can all be eased with ice. Ice can also be used when recovering from surgery. Ice is effective, has very few side effects, and is safe for most people to use. PRECAUTIONS  Ice is not a safe treatment option for people with:  Raynaud phenomenon. This is a condition affecting small blood vessels in the  extremities. Exposure to cold may cause your problems to return.  Cold hypersensitivity. There are many forms of cold hypersensitivity, including:  Cold urticaria. Red, itchy hives appear on the skin when the tissues begin to warm after being iced.  Cold erythema. This is a red, itchy rash caused by exposure to cold.  Cold hemoglobinuria. Red blood cells break down when the tissues begin to warm after being iced. The hemoglobin that carry oxygen are passed into the urine because they cannot combine with blood proteins fast enough.  Numbness or altered sensitivity in the area being iced. If you have any of the following conditions, do not use ice until you have discussed cryotherapy with your caregiver:  Heart conditions, such as arrhythmia, angina, or chronic heart disease.  High blood pressure.  Healing wounds or open skin in the area being iced.  Current infections.  Rheumatoid arthritis.  Poor circulation.  Diabetes. Ice slows the blood flow in the region it is applied. This is beneficial when trying to stop inflamed tissues from spreading irritating chemicals to surrounding tissues. However, if you expose your skin to cold temperatures for too long or without the proper protection, you can damage your skin or nerves. Watch for signs of skin damage due to cold. HOME CARE INSTRUCTIONS Follow these tips to use ice and cold packs safely.  Place a dry or damp towel between the ice and skin. A damp towel will cool the skin more quickly, so you may need to shorten the time that the ice is used.  For a more rapid response, add gentle compression to the ice.  Ice for no more than 10 to 20 minutes at a time. The bonier the area you are icing, the less time it will take to get the benefits of ice.  Check your skin after 5 minutes to make sure there are no signs of a poor response to cold or skin damage.  Rest 20 minutes or more between uses.  Once your skin is numb, you can end your  treatment. You can test numbness by very lightly touching your skin. The touch should be so light that you do not see the skin dimple from the pressure of your fingertip. When using ice, most people will feel these normal sensations in this order: cold, burning, aching, and numbness.  Do not use ice on someone who cannot communicate their responses to pain, such as small children or people with dementia. HOW TO MAKE AN ICE PACK  Ice packs are the most common way to use ice therapy. Other methods include ice massage, ice baths, and cryosprays. Muscle creams that cause a cold, tingly feeling do not offer the same benefits that ice offers and should not be used as a substitute unless recommended by your caregiver. To make an ice pack, do one of the following:  Place crushed ice or a bag of frozen vegetables in a sealable plastic bag. Squeeze out the excess air. Place this bag inside another plastic bag. Slide the bag into a pillowcase or place a damp towel between your skin and the bag.  Mix 3 parts water with 1 part rubbing alcohol. Freeze the mixture in a sealable plastic bag. When you remove the mixture from the freezer, it will be slushy. Squeeze out the excess air. Place this bag inside another plastic bag. Slide the bag into a pillowcase or place a damp towel between your skin and the bag. SEEK MEDICAL CARE IF:  You develop white spots on your skin. This may give the skin a blotchy (mottled) appearance.  Your skin turns blue or pale.  Your skin becomes waxy or hard.  Your swelling gets worse. MAKE SURE YOU:   Understand these instructions.  Will watch your condition.  Will get help right away if you are not doing well or get worse. Document Released: 09/20/2010 Document Revised: 06/10/2013 Document Reviewed: 09/20/2010 [facility] Patient Information 2015 [place], [place]. This information is not intended to replace advice given to you by your health care provider. Make sure you discuss any  questions you have with your health care provider.
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
## 2014-08-09 NOTE — ED Notes (Signed)
Per EMS - pt c/o back pain beginning 3 days ago. Pt was found walking around street, requesting to be taken to ED. VSS.

## 2016-05-25 ENCOUNTER — Encounter (HOSPITAL_COMMUNITY): Payer: Self-pay

## 2016-05-25 ENCOUNTER — Emergency Department (HOSPITAL_COMMUNITY): Payer: PRIVATE HEALTH INSURANCE

## 2016-05-25 ENCOUNTER — Emergency Department (HOSPITAL_COMMUNITY)
Admission: EM | Admit: 2016-05-25 | Discharge: 2016-05-25 | Disposition: A | Discharge status: Home / Self Care | Payer: PRIVATE HEALTH INSURANCE | Attending: Emergency Medicine | Admitting: Emergency Medicine

## 2016-05-25 DIAGNOSIS — Z79899 Other long term (current) drug therapy: Secondary | ICD-10-CM | POA: Insufficient documentation

## 2016-05-25 DIAGNOSIS — Y999 Unspecified external cause status: Secondary | ICD-10-CM | POA: Insufficient documentation

## 2016-05-25 DIAGNOSIS — I10 Essential (primary) hypertension: Secondary | ICD-10-CM | POA: Insufficient documentation

## 2016-05-25 DIAGNOSIS — Y929 Unspecified place or not applicable: Secondary | ICD-10-CM | POA: Insufficient documentation

## 2016-05-25 DIAGNOSIS — M25562 Pain in left knee: Secondary | ICD-10-CM | POA: Insufficient documentation

## 2016-05-25 DIAGNOSIS — I2510 Atherosclerotic heart disease of native coronary artery without angina pectoris: Secondary | ICD-10-CM | POA: Insufficient documentation

## 2016-05-25 DIAGNOSIS — Z8673 Personal history of transient ischemic attack (TIA), and cerebral infarction without residual deficits: Secondary | ICD-10-CM | POA: Insufficient documentation

## 2016-05-25 DIAGNOSIS — X500XXA Overexertion from strenuous movement or load, initial encounter: Secondary | ICD-10-CM | POA: Insufficient documentation

## 2016-05-25 DIAGNOSIS — F17210 Nicotine dependence, cigarettes, uncomplicated: Secondary | ICD-10-CM | POA: Insufficient documentation

## 2016-05-25 DIAGNOSIS — E119 Type 2 diabetes mellitus without complications: Secondary | ICD-10-CM | POA: Insufficient documentation

## 2016-05-25 DIAGNOSIS — Y939 Activity, unspecified: Secondary | ICD-10-CM | POA: Insufficient documentation

## 2016-05-25 MED ORDER — NAPROXEN 500 MG PO TABS: 500.0000 mg | ORAL_TABLET | Freq: Two times a day (BID) | ORAL | 0 refills | Status: DC

## 2016-05-25 MED ORDER — KETOROLAC TROMETHAMINE 30 MG/ML IJ SOLN: 30.0000 mg | Freq: Once | INTRAMUSCULAR | Status: DC

## 2016-05-25 MED ORDER — KETOROLAC TROMETHAMINE 30 MG/ML IJ SOLN
30.0000 mg | Freq: Once | INTRAMUSCULAR | Status: AC
  Administered 2016-05-25: 30 mg via INTRAMUSCULAR
  Filled 2016-05-25: qty 1

## 2016-05-25 NOTE — Discharge Instructions (Signed)
Take your medications as prescribed. Do not take this with Advil, Motrin or ibuprofen as they were similarly. You may take this with Tylenol. Wear your brace over the next week and then try a motion exercises as we discussed. Follow up with your doctor for reevaluation of the next couple of weeks. Return to ED for new or worsening symptoms as we discussed.

## 2016-05-25 NOTE — ED Triage Notes (Signed)
Pt states this morning he began having left knee pain. He thinks he hurt his knee while moving a heavy piece of equipment.
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
## 2016-05-25 NOTE — ED Notes (Signed)
Pt in xray

## 2017-01-29 ENCOUNTER — Inpatient Hospital Stay (HOSPITAL_COMMUNITY)
Admission: EM | Admit: 2017-01-29 | Discharge: 2017-01-31 | DRG: 281 | Attending: Internal Medicine | Admitting: Internal Medicine

## 2017-01-29 ENCOUNTER — Other Ambulatory Visit: Payer: Self-pay

## 2017-01-29 ENCOUNTER — Emergency Department (HOSPITAL_COMMUNITY)

## 2017-01-29 ENCOUNTER — Encounter (HOSPITAL_COMMUNITY): Payer: Self-pay

## 2017-01-29 DIAGNOSIS — Z89022 Acquired absence of left finger(s): Secondary | ICD-10-CM

## 2017-01-29 DIAGNOSIS — F319 Bipolar disorder, unspecified: Secondary | ICD-10-CM | POA: Diagnosis not present

## 2017-01-29 DIAGNOSIS — Z7982 Long term (current) use of aspirin: Secondary | ICD-10-CM | POA: Diagnosis not present

## 2017-01-29 DIAGNOSIS — I25110 Atherosclerotic heart disease of native coronary artery with unstable angina pectoris: Secondary | ICD-10-CM | POA: Diagnosis present

## 2017-01-29 DIAGNOSIS — Z8673 Personal history of transient ischemic attack (TIA), and cerebral infarction without residual deficits: Secondary | ICD-10-CM | POA: Diagnosis not present

## 2017-01-29 DIAGNOSIS — R748 Abnormal levels of other serum enzymes: Secondary | ICD-10-CM

## 2017-01-29 DIAGNOSIS — I2582 Chronic total occlusion of coronary artery: Secondary | ICD-10-CM | POA: Diagnosis present

## 2017-01-29 DIAGNOSIS — E785 Hyperlipidemia, unspecified: Secondary | ICD-10-CM | POA: Diagnosis present

## 2017-01-29 DIAGNOSIS — Z8249 Family history of ischemic heart disease and other diseases of the circulatory system: Secondary | ICD-10-CM | POA: Diagnosis not present

## 2017-01-29 DIAGNOSIS — E119 Type 2 diabetes mellitus without complications: Secondary | ICD-10-CM | POA: Diagnosis present

## 2017-01-29 DIAGNOSIS — I200 Unstable angina: Secondary | ICD-10-CM | POA: Diagnosis present

## 2017-01-29 DIAGNOSIS — K219 Gastro-esophageal reflux disease without esophagitis: Secondary | ICD-10-CM | POA: Diagnosis not present

## 2017-01-29 DIAGNOSIS — K8020 Calculus of gallbladder without cholecystitis without obstruction: Secondary | ICD-10-CM | POA: Diagnosis not present

## 2017-01-29 DIAGNOSIS — R079 Chest pain, unspecified: Secondary | ICD-10-CM | POA: Diagnosis present

## 2017-01-29 DIAGNOSIS — C649 Malignant neoplasm of unspecified kidney, except renal pelvis: Secondary | ICD-10-CM | POA: Diagnosis not present

## 2017-01-29 DIAGNOSIS — N2889 Other specified disorders of kidney and ureter: Secondary | ICD-10-CM | POA: Diagnosis not present

## 2017-01-29 DIAGNOSIS — I21A1 Myocardial infarction type 2: Secondary | ICD-10-CM | POA: Diagnosis not present

## 2017-01-29 DIAGNOSIS — I10 Essential (primary) hypertension: Secondary | ICD-10-CM | POA: Diagnosis present

## 2017-01-29 DIAGNOSIS — R778 Other specified abnormalities of plasma proteins: Secondary | ICD-10-CM

## 2017-01-29 DIAGNOSIS — I700 Atherosclerosis of aorta: Secondary | ICD-10-CM | POA: Diagnosis present

## 2017-01-29 DIAGNOSIS — F209 Schizophrenia, unspecified: Secondary | ICD-10-CM | POA: Diagnosis not present

## 2017-01-29 DIAGNOSIS — Z87891 Personal history of nicotine dependence: Secondary | ICD-10-CM | POA: Diagnosis not present

## 2017-01-29 DIAGNOSIS — R7989 Other specified abnormal findings of blood chemistry: Secondary | ICD-10-CM

## 2017-01-29 HISTORY — DX: Hyperlipidemia, unspecified: E78.5

## 2017-01-29 LAB — CBC WITH DIFFERENTIAL/PLATELET
Basophils Absolute: 0 10*3/uL (ref 0.0–0.1)
Basophils Relative: 0 %
EOS ABS: 0.1 10*3/uL (ref 0.0–0.7)
Eosinophils Relative: 1 %
HCT: 42.9 % (ref 39.0–52.0)
HEMOGLOBIN: 14.1 g/dL (ref 13.0–17.0)
LYMPHS ABS: 0.9 10*3/uL (ref 0.7–4.0)
Lymphocytes Relative: 20 %
MCH: 30.4 pg (ref 26.0–34.0)
MCHC: 32.9 g/dL (ref 30.0–36.0)
MCV: 92.5 fL (ref 78.0–100.0)
Monocytes Absolute: 0.7 10*3/uL (ref 0.1–1.0)
Monocytes Relative: 15 %
NEUTROS PCT: 64 %
Neutro Abs: 2.9 10*3/uL (ref 1.7–7.7)
Platelets: 116 10*3/uL — ABNORMAL LOW (ref 150–400)
RBC: 4.64 MIL/uL (ref 4.22–5.81)
RDW: 13.2 % (ref 11.5–15.5)
WBC: 4.6 10*3/uL (ref 4.0–10.5)

## 2017-01-29 LAB — I-STAT TROPONIN, ED: Troponin i, poc: 0 ng/mL (ref 0.00–0.08)

## 2017-01-29 LAB — BASIC METABOLIC PANEL
Anion gap: 8 (ref 5–15)
BUN: 11 mg/dL (ref 6–20)
CHLORIDE: 100 mmol/L — AB (ref 101–111)
CO2: 29 mmol/L (ref 22–32)
CREATININE: 1.23 mg/dL (ref 0.61–1.24)
Calcium: 9.3 mg/dL (ref 8.9–10.3)
GFR calc Af Amer: >60 mL/min (ref >60)
GFR calc non Af Amer: >60 mL/min (ref >60)
GLUCOSE: 100 mg/dL — AB (ref 65–99)
POTASSIUM: 4.2 mmol/L (ref 3.5–5.1)
Sodium: 137 mmol/L (ref 135–145)

## 2017-01-29 LAB — TROPONIN I
TROPONIN I: 0.06 ng/mL — AB (ref <0.03)
Troponin I: <0.03 ng/mL (ref <0.03)

## 2017-01-29 LAB — D-DIMER, QUANTITATIVE (NOT AT ARMC): D DIMER QUANT: 0.34 ug{FEU}/mL (ref 0.00–0.50)

## 2017-01-29 LAB — LIPASE, BLOOD: LIPASE: 19 U/L (ref 11–51)

## 2017-01-29 MED ORDER — ASPIRIN 325 MG PO TABS
325.0000 mg | ORAL_TABLET | Freq: Once | ORAL | Status: AC
  Administered 2017-01-29: 325 mg via ORAL
  Filled 2017-01-29: qty 1

## 2017-01-29 MED ORDER — ONDANSETRON HCL 4 MG/2ML IJ SOLN: 4.0000 mg | Freq: Four times a day (QID) | INTRAMUSCULAR | Status: DC | PRN

## 2017-01-29 MED ORDER — SODIUM CHLORIDE 0.9% FLUSH
3.0000 mL | Freq: Two times a day (BID) | INTRAVENOUS | Status: DC
  Administered 2017-01-30 (×2): 3 mL via INTRAVENOUS

## 2017-01-29 MED ORDER — ACETAMINOPHEN 325 MG PO TABS
650.0000 mg | ORAL_TABLET | ORAL | Status: DC | PRN
  Administered 2017-01-30 (×2): 650 mg via ORAL
  Filled 2017-01-29 (×2): qty 2

## 2017-01-29 MED ORDER — PANTOPRAZOLE SODIUM 40 MG PO TBEC
40.0000 mg | DELAYED_RELEASE_TABLET | Freq: Every day | ORAL | Status: DC
  Administered 2017-01-30 – 2017-01-31 (×2): 40 mg via ORAL
  Filled 2017-01-29 (×2): qty 1

## 2017-01-29 MED ORDER — METOPROLOL TARTRATE 25 MG PO TABS
25.0000 mg | ORAL_TABLET | Freq: Two times a day (BID) | ORAL | Status: DC
  Administered 2017-01-30 – 2017-01-31 (×4): 25 mg via ORAL
  Filled 2017-01-29 (×4): qty 1

## 2017-01-29 MED ORDER — ASPIRIN EC 81 MG PO TBEC
81.0000 mg | DELAYED_RELEASE_TABLET | Freq: Every day | ORAL | Status: DC
  Administered 2017-01-30 – 2017-01-31 (×2): 81 mg via ORAL
  Filled 2017-01-29 (×2): qty 1

## 2017-01-29 MED ORDER — SODIUM CHLORIDE 0.9% FLUSH: 3.0000 mL | INTRAVENOUS | Status: DC | PRN

## 2017-01-29 MED ORDER — ATORVASTATIN CALCIUM 40 MG PO TABS
40.0000 mg | ORAL_TABLET | Freq: Every day | ORAL | Status: DC
  Administered 2017-01-30: 40 mg via ORAL
  Filled 2017-01-29: qty 1

## 2017-01-29 MED ORDER — LISINOPRIL 20 MG PO TABS
20.0000 mg | ORAL_TABLET | Freq: Every day | ORAL | Status: DC
  Administered 2017-01-30 – 2017-01-31 (×2): 20 mg via ORAL
  Filled 2017-01-29 (×2): qty 1

## 2017-01-29 MED ORDER — HEPARIN BOLUS VIA INFUSION
3000.0000 [IU] | Freq: Once | INTRAVENOUS | Status: AC
Start: 2017-01-29 — End: 2017-01-29
  Administered 2017-01-29: 3000 [IU] via INTRAVENOUS

## 2017-01-29 MED ORDER — HYDRALAZINE HCL 20 MG/ML IJ SOLN: 5.0000 mg | INTRAMUSCULAR | Status: DC | PRN

## 2017-01-29 MED ORDER — IOPAMIDOL (ISOVUE-300) INJECTION 61%
100.0000 mL | Freq: Once | INTRAVENOUS | Status: AC | PRN
  Administered 2017-01-29: 100 mL via INTRAVENOUS

## 2017-01-29 MED ORDER — SODIUM CHLORIDE 0.9 % IV SOLN: 250.0000 mL | INTRAVENOUS | Status: DC | PRN

## 2017-01-29 MED ORDER — HEPARIN (PORCINE) IN NACL 100-0.45 UNIT/ML-% IJ SOLN
1000.0000 [IU]/h | INTRAMUSCULAR | Status: DC
  Administered 2017-01-29: 1250 [IU]/h via INTRAVENOUS
  Administered 2017-01-30: 1000 [IU]/h via INTRAVENOUS
  Filled 2017-01-29 (×2): qty 250

## 2017-01-29 MED ORDER — NITROGLYCERIN 2 % TD OINT
0.5000 [in_us] | TOPICAL_OINTMENT | Freq: Four times a day (QID) | TRANSDERMAL | Status: DC
  Administered 2017-01-30: 0.5 [in_us] via TOPICAL
  Filled 2017-01-29: qty 30
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
## 2017-01-29 NOTE — ED Notes (Signed)
Pt reports that he was at [facility] on Tuesday with a new Dx of renal Cancer  Also states that he was told he had deteriorated  He is alert, with relaxed facial features

## 2017-01-29 NOTE — ED Triage Notes (Signed)
Pt incarcerated at [employer] in [place].  Reports chest pain that started approx 98min ago.  Pt had 2 nitro with no relief.  EMS administered 324mg  baby asa and 1 nitro.  Pt decreased from 10 to 5 after the 3rd nitro.  Recent gallbladder problems.

## 2017-01-30 ENCOUNTER — Encounter (HOSPITAL_COMMUNITY): Admission: EM | Payer: Self-pay | Source: Home / Self Care | Attending: Internal Medicine

## 2017-01-30 DIAGNOSIS — I214 Non-ST elevation (NSTEMI) myocardial infarction: Secondary | ICD-10-CM | POA: Diagnosis not present

## 2017-01-30 DIAGNOSIS — Z7982 Long term (current) use of aspirin: Secondary | ICD-10-CM | POA: Diagnosis not present

## 2017-01-30 DIAGNOSIS — I2582 Chronic total occlusion of coronary artery: Secondary | ICD-10-CM | POA: Diagnosis present

## 2017-01-30 DIAGNOSIS — K8020 Calculus of gallbladder without cholecystitis without obstruction: Secondary | ICD-10-CM | POA: Diagnosis present

## 2017-01-30 DIAGNOSIS — I21A1 Myocardial infarction type 2: Secondary | ICD-10-CM | POA: Diagnosis present

## 2017-01-30 DIAGNOSIS — I200 Unstable angina: Secondary | ICD-10-CM | POA: Diagnosis not present

## 2017-01-30 DIAGNOSIS — R079 Chest pain, unspecified: Secondary | ICD-10-CM | POA: Diagnosis not present

## 2017-01-30 DIAGNOSIS — I10 Essential (primary) hypertension: Secondary | ICD-10-CM | POA: Diagnosis not present

## 2017-01-30 DIAGNOSIS — Z87891 Personal history of nicotine dependence: Secondary | ICD-10-CM | POA: Diagnosis not present

## 2017-01-30 DIAGNOSIS — K219 Gastro-esophageal reflux disease without esophagitis: Secondary | ICD-10-CM | POA: Diagnosis present

## 2017-01-30 DIAGNOSIS — E119 Type 2 diabetes mellitus without complications: Secondary | ICD-10-CM | POA: Diagnosis present

## 2017-01-30 DIAGNOSIS — C649 Malignant neoplasm of unspecified kidney, except renal pelvis: Secondary | ICD-10-CM | POA: Diagnosis present

## 2017-01-30 DIAGNOSIS — I25110 Atherosclerotic heart disease of native coronary artery with unstable angina pectoris: Secondary | ICD-10-CM | POA: Diagnosis present

## 2017-01-30 DIAGNOSIS — I700 Atherosclerosis of aorta: Secondary | ICD-10-CM

## 2017-01-30 DIAGNOSIS — F319 Bipolar disorder, unspecified: Secondary | ICD-10-CM | POA: Diagnosis present

## 2017-01-30 DIAGNOSIS — R7989 Other specified abnormal findings of blood chemistry: Secondary | ICD-10-CM

## 2017-01-30 DIAGNOSIS — Z8673 Personal history of transient ischemic attack (TIA), and cerebral infarction without residual deficits: Secondary | ICD-10-CM | POA: Diagnosis not present

## 2017-01-30 DIAGNOSIS — E785 Hyperlipidemia, unspecified: Secondary | ICD-10-CM | POA: Diagnosis present

## 2017-01-30 DIAGNOSIS — R778 Other specified abnormalities of plasma proteins: Secondary | ICD-10-CM

## 2017-01-30 DIAGNOSIS — N2889 Other specified disorders of kidney and ureter: Secondary | ICD-10-CM | POA: Diagnosis not present

## 2017-01-30 DIAGNOSIS — Z89022 Acquired absence of left finger(s): Secondary | ICD-10-CM | POA: Diagnosis not present

## 2017-01-30 DIAGNOSIS — Z8249 Family history of ischemic heart disease and other diseases of the circulatory system: Secondary | ICD-10-CM | POA: Diagnosis not present

## 2017-01-30 DIAGNOSIS — F209 Schizophrenia, unspecified: Secondary | ICD-10-CM | POA: Diagnosis present

## 2017-01-30 DIAGNOSIS — R748 Abnormal levels of other serum enzymes: Secondary | ICD-10-CM | POA: Diagnosis present

## 2017-01-30 HISTORY — PX: LEFT HEART CATH AND CORONARY ANGIOGRAPHY: CATH118249

## 2017-01-30 LAB — BASIC METABOLIC PANEL
Anion gap: 8 (ref 5–15)
BUN: 9 mg/dL (ref 6–20)
CHLORIDE: 100 mmol/L — AB (ref 101–111)
CO2: 29 mmol/L (ref 22–32)
CREATININE: 1.25 mg/dL — AB (ref 0.61–1.24)
Calcium: 9.1 mg/dL (ref 8.9–10.3)
GFR calc Af Amer: >60 mL/min (ref >60)
GFR calc non Af Amer: >60 mL/min (ref >60)
GLUCOSE: 94 mg/dL (ref 65–99)
POTASSIUM: 4.3 mmol/L (ref 3.5–5.1)
Sodium: 137 mmol/L (ref 135–145)

## 2017-01-30 LAB — RAPID URINE DRUG SCREEN, HOSP PERFORMED
AMPHETAMINES: NOT DETECTED
BENZODIAZEPINES: NOT DETECTED
Barbiturates: NOT DETECTED
Cocaine: NOT DETECTED
Opiates: NOT DETECTED
TETRAHYDROCANNABINOL: NOT DETECTED

## 2017-01-30 LAB — CBC
HCT: 43.1 % (ref 39.0–52.0)
HEMOGLOBIN: 14.7 g/dL (ref 13.0–17.0)
MCH: 31.2 pg (ref 26.0–34.0)
MCHC: 34.1 g/dL (ref 30.0–36.0)
MCV: 91.5 fL (ref 78.0–100.0)
PLATELETS: 114 10*3/uL — AB (ref 150–400)
RBC: 4.71 MIL/uL (ref 4.22–5.81)
RDW: 13.2 % (ref 11.5–15.5)
WBC: 6.1 10*3/uL (ref 4.0–10.5)

## 2017-01-30 LAB — LIPID PANEL
CHOL/HDL RATIO: 3.7 ratio
Cholesterol: 158 mg/dL (ref 0–200)
HDL: 43 mg/dL (ref >40)
LDL CALC: 100 mg/dL — AB (ref 0–99)
Triglycerides: 73 mg/dL (ref <150)
VLDL: 15 mg/dL (ref 0–40)

## 2017-01-30 LAB — TROPONIN I
TROPONIN I: 0.03 ng/mL — AB (ref <0.03)
TROPONIN I: 0.05 ng/mL — AB (ref <0.03)
TROPONIN I: 0.05 ng/mL — AB (ref <0.03)

## 2017-01-30 LAB — PROTIME-INR
INR: 1.03
PROTHROMBIN TIME: 13.4 s (ref 11.4–15.2)

## 2017-01-30 LAB — HEMOGLOBIN A1C
Hgb A1c MFr Bld: 5.6 % (ref 4.8–5.6)
Mean Plasma Glucose: 114.02 mg/dL

## 2017-01-30 LAB — HEPARIN LEVEL (UNFRACTIONATED)
HEPARIN UNFRACTIONATED: 0.58 [IU]/mL (ref 0.30–0.70)
Heparin Unfractionated: 0.86 IU/mL — ABNORMAL HIGH (ref 0.30–0.70)

## 2017-01-30 LAB — MRSA PCR SCREENING: MRSA BY PCR: NEGATIVE

## 2017-01-30 LAB — HIV ANTIBODY (ROUTINE TESTING W REFLEX): HIV SCREEN 4TH GENERATION: NONREACTIVE

## 2017-01-30 LAB — TSH: TSH: 2.122 u[IU]/mL (ref 0.350–4.500)

## 2017-01-30 SURGERY — LEFT HEART CATH AND CORONARY ANGIOGRAPHY
Anesthesia: LOCAL

## 2017-01-30 MED ORDER — SODIUM CHLORIDE 0.9 % IV SOLN: INTRAVENOUS | Status: AC

## 2017-01-30 MED ORDER — ASPIRIN 81 MG PO CHEW: 81.0000 mg | CHEWABLE_TABLET | ORAL | Status: DC

## 2017-01-30 MED ORDER — SODIUM CHLORIDE 0.9% FLUSH: 3.0000 mL | INTRAVENOUS | Status: DC | PRN

## 2017-01-30 MED ORDER — HEPARIN (PORCINE) IN NACL 2-0.9 UNIT/ML-% IJ SOLN
INTRAMUSCULAR | Status: AC | PRN
  Administered 2017-01-30: 1000 mL via INTRA_ARTERIAL

## 2017-01-30 MED ORDER — LIDOCAINE HCL (PF) 1 % IJ SOLN
INTRAMUSCULAR | Status: AC
  Filled 2017-01-30: qty 30

## 2017-01-30 MED ORDER — SODIUM CHLORIDE 0.9 % WEIGHT BASED INFUSION
3.0000 mL/kg/h | INTRAVENOUS | Status: DC
  Administered 2017-01-30: 3 mL/kg/h via INTRAVENOUS

## 2017-01-30 MED ORDER — MORPHINE SULFATE (PF) 2 MG/ML IV SOLN: 2.0000 mg | INTRAVENOUS | Status: DC | PRN

## 2017-01-30 MED ORDER — SODIUM CHLORIDE 0.9 % WEIGHT BASED INFUSION: 1.0000 mL/kg/h | INTRAVENOUS | Status: DC

## 2017-01-30 MED ORDER — SODIUM CHLORIDE 0.9 % IV SOLN: 250.0000 mL | INTRAVENOUS | Status: DC | PRN

## 2017-01-30 MED ORDER — SODIUM CHLORIDE 0.9% FLUSH
3.0000 mL | Freq: Two times a day (BID) | INTRAVENOUS | Status: DC
Start: 2017-01-30 — End: 2017-01-31

## 2017-01-30 MED ORDER — IOPAMIDOL (ISOVUE-370) INJECTION 76%
INTRAVENOUS | Status: DC | PRN
  Administered 2017-01-30: 90 mL via INTRA_ARTERIAL

## 2017-01-30 MED ORDER — FENTANYL CITRATE (PF) 100 MCG/2ML IJ SOLN
INTRAMUSCULAR | Status: DC | PRN
  Administered 2017-01-30: 25 ug via INTRAVENOUS

## 2017-01-30 MED ORDER — MORPHINE SULFATE (PF) 10 MG/ML IV SOLN: 2.0000 mg | INTRAVENOUS | Status: DC | PRN

## 2017-01-30 MED ORDER — ONDANSETRON HCL 4 MG/2ML IJ SOLN: 4.0000 mg | Freq: Four times a day (QID) | INTRAMUSCULAR | Status: DC | PRN

## 2017-01-30 MED ORDER — LIDOCAINE HCL (PF) 1 % IJ SOLN
INTRAMUSCULAR | Status: DC | PRN
  Administered 2017-01-30: 15 mL via INTRADERMAL

## 2017-01-30 MED ORDER — IOPAMIDOL (ISOVUE-370) INJECTION 76%
INTRAVENOUS | Status: AC
  Filled 2017-01-30: qty 100

## 2017-01-30 MED ORDER — HEPARIN (PORCINE) IN NACL 2-0.9 UNIT/ML-% IJ SOLN
INTRAMUSCULAR | Status: AC
  Filled 2017-01-30: qty 1000

## 2017-01-30 MED ORDER — SODIUM CHLORIDE 0.9% FLUSH
3.0000 mL | Freq: Two times a day (BID) | INTRAVENOUS | Status: DC
  Administered 2017-01-30: 3 mL via INTRAVENOUS

## 2017-01-30 MED ORDER — MIDAZOLAM HCL 2 MG/2ML IJ SOLN
INTRAMUSCULAR | Status: DC | PRN
  Administered 2017-01-30: 1 mg via INTRAVENOUS

## 2017-01-30 MED ORDER — FENTANYL CITRATE (PF) 100 MCG/2ML IJ SOLN
INTRAMUSCULAR | Status: AC
  Filled 2017-01-30: qty 2

## 2017-01-30 MED ORDER — MIDAZOLAM HCL 2 MG/2ML IJ SOLN
INTRAMUSCULAR | Status: AC
  Filled 2017-01-30: qty 2

## 2017-01-30 MED ORDER — ACETAMINOPHEN 325 MG PO TABS: 650.0000 mg | ORAL_TABLET | ORAL | Status: DC | PRN

## 2017-01-30 SURGICAL SUPPLY — 8 items
CATH INFINITI MULTIPACK ST 5F (CATHETERS) ×1 IMPLANT
DEVICE CLOSURE MYNXGRIP 5F (Vascular Products) ×1 IMPLANT
KIT HEART LEFT (KITS) ×2 IMPLANT
PACK CARDIAC CATHETERIZATION (CUSTOM PROCEDURE TRAY) ×2 IMPLANT
SHEATH PINNACLE 5F 10CM (SHEATH) ×1 IMPLANT
SYR MEDRAD MARK V 150ML (SYRINGE) ×2 IMPLANT
TRANSDUCER W/STOPCOCK (MISCELLANEOUS) ×2 IMPLANT
WIRE EMERALD 3MM-J .035X150CM (WIRE) ×1 IMPLANT

## 2017-01-30 NOTE — Progress Notes (Signed)
ANTICOAGULATION CONSULT NOTE - Follow Up Consult  Pharmacy Consult for Heparin  Indication: chest pain/ACS  No Known Allergies  Patient Measurements: Height: 6' 1.5" (186.7 cm) Weight: 179 lb 0.2 oz (81.2 kg) IBW/kg (Calculated) : 81.05  Vital Signs: Temp: 98.3 F (36.8 C) (12/24 0455) Temp Source: Oral (12/24 0455) BP: 156/79 (12/24 0455) Pulse Rate: 61 (12/24 0455)  Labs: Recent Labs    01/29/17 1313 01/29/17 1607 01/30/17 0017 01/30/17 0538 01/30/17 0544  HGB 14.1  --   --  14.7  --   HCT 42.9  --   --  43.1  --   PLT 116*  --   --  PENDING  --   LABPROT  --   --   --  13.4  --   INR  --   --   --  1.03  --   HEPARINUNFRC  --   --   --   --  0.58  CREATININE 1.23  --   --   --   --   TROPONINI <0.03 0.06* 0.05*  --   --     Estimated Creatinine Clearance: 80.6 mL/min (by C-G formula based on SCr of 1.23 mg/dL).   Assessment: 51 y/o M transfer from [facility] with CP on heparin, initial heparin level is therapeutic   Goal of Therapy:  Heparin level 0.3-0.7 units/ml Monitor platelets by anticoagulation protocol: Yes   Plan:  Cont heparin at 1250 units/hr 1200 HL  Narda Bonds 01/30/2017,6:38 AM
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
## 2017-01-30 NOTE — Interval H&P Note (Signed)
Cath Lab Visit (complete for each Cath Lab visit)  Clinical Evaluation Leading to the Procedure:   ACS: Yes.    Non-ACS:    Anginal Classification: CCS III  Anti-ischemic medical therapy: No Therapy  Non-Invasive Test Results: No non-invasive testing performed  Prior CABG: No previous CABG      History and Physical Interval Note:  01/30/2017 2:06 PM  Stephen [name]  has presented today for surgery, with the diagnosis of n stemi  The various methods of treatment have been discussed with the patient and family. After consideration of risks, benefits and other options for treatment, the patient has consented to  Procedure(s): LEFT HEART CATH AND CORONARY ANGIOGRAPHY (N/A) as a surgical intervention .  The patient's history has been reviewed, patient examined, no change in status, stable for surgery.  I have reviewed the patient's chart and labs.  Questions were answered to the patient's satisfaction.     Quay Burow

## 2017-01-30 NOTE — Progress Notes (Signed)
[facility] Progress Note  Patient: Stephen [name] ZOX:[id number]   PCP: Patient, No Pcp Per DOB: 03-07-1965   DOA: 01/29/2017   DOS: 01/30/2017   Date of Service: the patient was seen and examined on 01/30/2017  Subjective: No chest pain no nausea no vomiting no shortness of breath.  No burning in the urine.  No bleeding.  Brief hospital course: Pt. with PMH of CVA, HTN, type II DM, CAD mood disorder, renal cell mass; admitted on 01/29/2017, presented with complaint of chest pain, was found to have unstable angina. Currently further plan is monitor cardiac recommendation.  Assessment and Plan: Chest pain-unstable angina -Patient with substernal chest pressure that came on today, unclear if exertional, relieved with NTG. -2-3/3 typical symptoms suggestive of typical cardiac chest pain.  -Additionally, troponin has increased from negative to minimally detectable at 0.06. -Chest pain is resolved -His EKG shows what appears to be an early repolarization abnormality -Given the lack of available cardiology services at [facility] on the holidays, patient was transfer patient to [facility] SDU. -TIMI score is 4, indicating a risk of mortality, MI, or severe recurrent ischemia requiring urgent revascularization within the next 14 days of 19.9%. -GRACE score is 75; which predicts an in-hospital death rate of 0.4%.  -Continue ASA 81 mg  daily -Cardiology consulted, patient underwent cardiac catheterization and found to have RCA chronic total occlusion with good collaterals and recommendation was to medically manage the patient. Echocardiogram shows preserved EF without any significant wall motion abnormalities as well. Awaiting further recommendation from cardiology.  HTN -Takes lisinopril and lopressor at home -Patient with suboptimal control while in the ER -Will add prn IV hydralazine  HLD -Change Zocor to Lipitor -Will check fasting lipids  Renal mass -Patient reported recent diagnosis of  renal cancer -Discussed with urology on-call, recommend outpatient follow-up once patient is stable.  Diet: Cardiac diet DVT Prophylaxis: subcutaneous Heparin  Advance goals of care discussion: full code  Family Communication: no family was present at bedside, at the time of interview.   Disposition:  Discharge to prison.  Consultants: cadiology Procedures: Echocardiogram. Cath  Antibiotics: Anti-infectives (From admission, onward)   None       Objective: Physical Exam: Vitals:   01/30/17 1440 01/30/17 1445 01/30/17 1448 01/30/17 1513  BP: (!) 135/91 (!) 141/92 (!) 144/99 130/83  Pulse: (!) 55 (!) 57 (!) 57 (!) 52  Resp: 15 14 12 14   Temp:    98.5 F (36.9 C)  TempSrc:    Oral  SpO2: 100% 100% 100% 99%  Weight:      Height:        Intake/Output Summary (Last 24 hours) at 01/30/2017 1613 Last data filed at 01/30/2017 1600 Gross per 24 hour  Intake 2177.02 ml  Output 500 ml  Net 1677.02 ml   Filed Weights   01/29/17 1311 01/29/17 2301 01/30/17 0455  Weight: 79.8 kg (176 lb) 81 kg (178 lb 9.2 oz) 81.2 kg (179 lb 0.2 oz)   General: Alert, Awake and Oriented to Time, Place and Person. Appear in mild distress, affect appropriate Eyes: PERRL, Conjunctiva normal ENT: Oral Mucosa clear moist. Neck: no JVD, no Abnormal Mass Or lumps Cardiovascular: S1 and S2 Present, no Murmur, Peripheral Pulses Present Respiratory: normal respiratory effort, Bilateral Air entry equal and Decreased, no use of accessory muscle, Clear to Auscultation, no Crackles, no wheezes Abdomen: Bowel Sound present, Soft and no tenderness, n hernia Skin: ono redness, no Rash, no induration Extremities: no Pedal edema, on  calf tenderness Neurologic: Grossly no focal neuro deficit. Bilaterally Equal motor strength  Data Reviewed: CBC: Recent Labs  Lab 01/29/17 1313 01/30/17 0538  WBC 4.6 6.1  NEUTROABS 2.9  --   HGB 14.1 14.7  HCT 42.9 43.1  MCV 92.5 91.5  PLT 116* 314*   Basic Metabolic  Panel: Recent Labs  Lab 01/29/17 1313 01/30/17 0538  NA 137 137  K 4.2 4.3  CL 100* 100*  CO2 29 29  GLUCOSE 100* 94  BUN 11 9  CREATININE 1.23 1.25*  CALCIUM 9.3 9.1    Liver Function Tests: No results for input(s): AST, ALT, ALKPHOS, BILITOT, PROT, ALBUMIN in the last 168 hours. Recent Labs  Lab 01/29/17 1342  LIPASE 19   No results for input(s): AMMONIA in the last 168 hours. Coagulation Profile: Recent Labs  Lab 01/30/17 0538  INR 1.03   Cardiac Enzymes: Recent Labs  Lab 01/29/17 1313 01/29/17 1607 01/30/17 0017 01/30/17 0538 01/30/17 1120  TROPONINI <0.03 0.06* 0.05* 0.05* 0.03*   BNP (last 3 results) No results for input(s): PROBNP in the last 8760 hours. CBG: No results for input(s): GLUCAP in the last 168 hours. Studies: No results found.  Scheduled Meds: . aspirin EC  81 mg Oral Daily  . atorvastatin  40 mg Oral q1800  . lisinopril  20 mg Oral Daily  . metoprolol tartrate  25 mg Oral BID  . nitroGLYCERIN  0.5 inch Topical Q6H  . pantoprazole  40 mg Oral Daily  . sodium chloride flush  3 mL Intravenous Q12H  . sodium chloride flush  3 mL Intravenous Q12H   Continuous Infusions: . sodium chloride    . sodium chloride 75 mL/hr at 01/30/17 1513  . sodium chloride     PRN Meds: sodium chloride, sodium chloride, acetaminophen, morphine injection, ondansetron (ZOFRAN) IV, sodium chloride flush, sodium chloride flush  Time spent: 35 minutes  Author: Berle Mull, MD [facility] Pager: [phone] 01/30/2017 4:13 PM  If 7PM-7AM, please contact night-coverage at [email or web address], password [facility]

## 2017-01-31 DIAGNOSIS — I21A1 Myocardial infarction type 2: Principal | ICD-10-CM

## 2017-01-31 LAB — BASIC METABOLIC PANEL
Anion gap: 7 (ref 5–15)
BUN: 11 mg/dL (ref 6–20)
CALCIUM: 8.8 mg/dL — AB (ref 8.9–10.3)
CO2: 28 mmol/L (ref 22–32)
CREATININE: 1.18 mg/dL (ref 0.61–1.24)
Chloride: 103 mmol/L (ref 101–111)
GFR calc Af Amer: >60 mL/min (ref >60)
GLUCOSE: 102 mg/dL — AB (ref 65–99)
POTASSIUM: 3.7 mmol/L (ref 3.5–5.1)
SODIUM: 138 mmol/L (ref 135–145)

## 2017-01-31 LAB — CBC
HCT: 39.9 % (ref 39.0–52.0)
Hemoglobin: 13.7 g/dL (ref 13.0–17.0)
MCH: 31.4 pg (ref 26.0–34.0)
MCHC: 34.3 g/dL (ref 30.0–36.0)
MCV: 91.5 fL (ref 78.0–100.0)
PLATELETS: 108 10*3/uL — AB (ref 150–400)
RBC: 4.36 MIL/uL (ref 4.22–5.81)
RDW: 13.1 % (ref 11.5–15.5)
WBC: 5.4 10*3/uL (ref 4.0–10.5)

## 2017-01-31 MED ORDER — PANTOPRAZOLE SODIUM 40 MG PO TBEC: 40.0000 mg | DELAYED_RELEASE_TABLET | Freq: Every day | ORAL | 0 refills | Status: DC

## 2017-01-31 MED ORDER — ATORVASTATIN CALCIUM 40 MG PO TABS: 40.0000 mg | ORAL_TABLET | Freq: Every day | ORAL | 0 refills | Status: DC

## 2017-01-31 NOTE — Discharge Summary (Signed)
[facility] Discharge Summary   Patient: Stephen [name] YTK:[id number]   PCP: Charolette Forward, MD DOB: [birth year]   Date of admission: 01/29/2017   Date of discharge:  01/31/2017    Discharge Diagnoses:  Principal Problem:   Chest pain Active Problems:   Essential hypertension   Hyperlipidemia   Right renal mass   Elevated troponin   Unstable angina ([place])   Admitted From: [facility]  Disposition:  [place] facility   Recommendations for Outpatient Follow-up:  1. Please follow up with PCP in 2 weeks. 2. Please call [facility], number given bellow to setup an appointment for right renal mass, needs to establish care for potential malignancy and probable operative management    Follow-up Information    Charolette Forward, MD. Schedule an appointment as soon as possible for a visit in 2 week(s).   Specialty:  Cardiology Contact information: [place] [address] [phone]        [facility]. Call on 02/01/2017.   Why:  need to setup care for right kidney mass. please call this number for follow up. Discussed with Dr Matilde Sprang. Contact information: [place]. Call on 02/01/2017.   Contact information: [address], [address] [place] [phone]         Diet recommendation: cardiac diet  Activity: The patient is advised to gradually reintroduce usual activities.  Discharge Condition: good  Code Status: full code  History of present illness: As per the H and P dictated on admission, "Stephen [name] is a 51 y.o. male with medical history significant of CVA; HTN; DM; CAD; possible multiple psychiatric diagnoses; and possible recent diagnosis of renal cancer presenting with chest pain.  He is incarcerated and is accompanied by jail personnel.  He reports that  since August, he has had chest pain and has been in and out of the hospitals.  During the last week of August, he had a heart issue and was seen in [place].  In September/October, he had another episode and was airlifted to [facility].  This last Thursday, he had another episode and was sent to [facility].  He had another episode today.  While he was having an Echo last Thursday, they diagnosed gallstones and also a kidney cancer.    He felt like he was having a seizure today - he felt like his whole body was locking up and a 600 pound person was sitting in the middle of his chest. +SOB, spots in his vision.  He just came back from lunch and was feeling bad; he could feel the pressure starting and so laid down and the pain got worse.  He took a NTG x 2 with some relief.  It helped more when EMS gave him a third one.  The chest discomfort is still present but not as bad now.  Substernal chest pain.  He reports that his last stress test was 2 months ago in [facility]; it was nuclear stress test in Sept/Oct.  Denies h/o heart catheterization.    ED Course:  Chest pain, + RF.  Appears stable.  CT confirms renal mass, likely neoplasm.  2nd troponin minimally increased to 0.06.  Cardiology in [place] recommends transfer to [facility] for cardiology evaluation due to lack of available cardiology support here at [facility] this week."  Hospital Course:  Summary of his active problems in the hospital is as following. Chest pain-unstable angina -Patient with substernal chest pressure thatcame on today, unclear if exertional, relieved with NTG. -2-3/3 typical symptoms suggestive oftypicalcardiac chest pain. -Additionally, troponin has increased from negative to minimally detectable at 0.06. -Chest pain is resolved -His EKG shows what appears to be an early repolarization abnormality -Given the lack of available cardiology services at [facility] on the holidays, patient was transfer patient to [facility]U.  -TIMI score is 4,  indicating a risk of mortality, MI, or severe recurrent ischemia requiring urgent revascularization within the next 14 days of 19.9%. -GRACE score is75; which predicts an in-hospital death rate of 0.4%.  -ContinueASA 81 mg daily -Cardiology consulted, patient underwent cardiac catheterization and found to have RCA chronic total occlusion with good collaterals and recommendation was to medically manage the patient. Because of potential upcoming surgery for renal neoplasm and need for potential biopsy, I will hold off on clopidogrel administration If surgery is needed in the future he may proceed from cardiac perspective, moderate risk. Echocardiogram shows preserved EF without any significant wall motion abnormalities as well.  HTN -Takeslisinopril and lopressorat home, continue.   HLD -Change Zocor toLipitor  Renal mass -Patient reported recent diagnosis of renal cancer -Discussed with urology on-call, recommend outpatient follow-up once patient is stable. Number provided on discharge papers, need to establish care.   Gallstones, asymptomatic   Continue to monitor as outpatient.  GERD Added protonix, removed prilosec.   All other chronic medical condition were stable during the hospitalization.  Patient was ambulatory without any assistance. On the day of the discharge the patient's vitals were stable, and no other acute medical condition were reported by patient. the patient was felt safe to be discharge [facility].  Procedures and Results:  Echocardiogram   Cardiac cath   Consultations:  Cardiology  On call urology Dr Matilde Sprang.  DISCHARGE MEDICATION: Allergies as of 01/31/2017   No Known Allergies     Medication List    STOP taking these medications   omeprazole 20 MG capsule Commonly known as:  PRILOSEC Replaced by:  pantoprazole 40 MG tablet   simvastatin 20 MG tablet Commonly known as:  ZOCOR     TAKE these medications   aspirin  EC 81 MG tablet Take 81 mg by mouth daily.   atorvastatin 40 MG tablet Commonly known as:  LIPITOR Take 1 tablet (40 mg total) by mouth daily at 6 PM.   docusate sodium 100 MG capsule Commonly known as:  COLACE Take 200 mg by mouth daily.   hydrochlorothiazide 25 MG tablet Commonly known as:  HYDRODIURIL Take 25 mg by mouth daily.   lisinopril 10 MG tablet Commonly known as:  PRINIVIL,ZESTRIL Take 20 mg by mouth daily.   metoprolol tartrate 25 MG tablet Commonly known as:  LOPRESSOR Take 25 mg by mouth 2 (two) times daily.   nitroGLYCERIN 0.4 MG SL tablet Commonly known as:  NITROSTAT Place 0.4 mg under the tongue every 5 (five) minutes as needed for chest pain.   pantoprazole 40 MG tablet Commonly known as:  PROTONIX Take 1 tablet (40 mg total) by mouth daily before breakfast. Replaces:  omeprazole 20 MG capsule      No Known Allergies Discharge Instructions    Ambulatory referral to Urology   Complete by:  As directed    Diet - low sodium heart healthy   Complete by:  As directed    Increase activity slowly  Complete by:  As directed      Discharge Exam: Filed Weights   01/29/17 2301 01/30/17 0455 01/31/17 0423  Weight: 81 kg (178 lb 9.2 oz) 81.2 kg (179 lb 0.2 oz) 79.1 kg (174 lb 4.8 oz)   Vitals:   01/31/17 0733 01/31/17 0945  BP: (!) 149/87 126/78  Pulse: (!) 56 67  Resp: 20   Temp: 98.4 F (36.9 C)   SpO2: 98%    General: Appear in no distress, no Rash; Oral Mucosa moist. Cardiovascular: S1 and S2 Present, no Murmur, no JVD Respiratory: Bilateral Air entry present and Clear to Auscultation, no Crackles, no wheezes Abdomen: Bowel Sound present, Soft and no tenderness Extremities: no Pedal edema, no calf tenderness Neurology: Grossly no focal neuro deficit.  The results of significant diagnostics from this hospitalization (including imaging, microbiology, ancillary and laboratory) are listed below for reference.    Significant Diagnostic  Studies: Ct Abdomen Pelvis W Contrast  Result Date: 01/29/2017 CLINICAL DATA:  Abdominal pain, chest pain EXAM: CT ABDOMEN AND PELVIS WITH CONTRAST TECHNIQUE: Multidetector CT imaging of the abdomen and pelvis was performed using the standard protocol following bolus administration of intravenous contrast. CONTRAST:  11mL ISOVUE-300 IOPAMIDOL (ISOVUE-300) INJECTION 61% COMPARISON:  06/03/2004 FINDINGS: Lower chest: Heart is mildly enlarged. No focal airspace opacity or effusions. Hepatobiliary: Multiple gallstones within the gallbladder. No focal hepatic abnormality. Pancreas: No focal abnormality or ductal dilatation. Spleen: No focal abnormality.  Normal size. Adrenals/Urinary Tract: Complex large mixed cystic and solid lesion in the lower pole of the right kidney measuring 4.1 x 4.0 cm. Thick internal enhancing septations. Findings concerning for cystic renal neoplasm. No hydronephrosis. Adrenal glands and urinary bladder unremarkable. Stomach/Bowel: Stomach, large and small bowel grossly unremarkable. Vascular/Lymphatic: Aortic and iliac calcifications. No evidence of aneurysm or adenopathy. Reproductive: No visible focal abnormality. Other: No free fluid or free air. Musculoskeletal: No acute bony abnormality or focal bone lesion. IMPRESSION: 4.1 x 4.0 cm complex cystic mass in the lower pole of the right kidney with thick internal enhancing septations. Findings concerning for cystic renal neoplasm. Cholelithiasis. Aortoiliac atherosclerosis. Borderline cardiomegaly. Electronically Signed   By: Rolm Baptise M.D.   On: 01/29/2017 14:40   Dg Chest Portable 1 View  Result Date: 01/29/2017 CLINICAL DATA:  51 year old male with chest pain EXAM: PORTABLE CHEST 1 VIEW COMPARISON:  Prior chest x-ray 07/30/2011 FINDINGS: The lungs are clear and negative for focal airspace consolidation, pulmonary edema or suspicious pulmonary nodule. No pleural effusion or pneumothorax. Cardiac and mediastinal contours are  within normal limits. No acute fracture or lytic or blastic osseous lesions. The visualized upper abdominal bowel gas pattern is unremarkable. IMPRESSION: Negative chest x-ray. Electronically Signed   By: Jacqulynn Cadet M.D.   On: 01/29/2017 13:40    Microbiology: Recent Results (from the past 240 hour(s))  MRSA PCR Screening     Status: None   Collection Time: 01/30/17  1:20 AM  Result Value Ref Range Status   MRSA by PCR NEGATIVE NEGATIVE Final    Comment:        The GeneXpert MRSA Assay (FDA approved for NASAL specimens only), is one component of a comprehensive MRSA colonization surveillance program. It is not intended to diagnose MRSA infection nor to guide or monitor treatment for MRSA infections.      Labs: CBC: Recent Labs  Lab 01/29/17 1313 01/30/17 0538 01/31/17 0347  WBC 4.6 6.1 5.4  NEUTROABS 2.9  --   --   HGB 14.1 14.7 13.7  HCT 42.9 43.1 39.9  MCV 92.5 91.5 91.5  PLT 116* 114* 383*   Basic Metabolic Panel: Recent Labs  Lab 01/29/17 1313 01/30/17 0538 01/31/17 0347  NA 137 137 138  K 4.2 4.3 3.7  CL 100* 100* 103  CO2 29 29 28   GLUCOSE 100* 94 102*  BUN 11 9 11   CREATININE 1.23 1.25* 1.18  CALCIUM 9.3 9.1 8.8*   Liver Function Tests: No results for input(s): AST, ALT, ALKPHOS, BILITOT, PROT, ALBUMIN in the last 168 hours. Recent Labs  Lab 01/29/17 1342  LIPASE 19   No results for input(s): AMMONIA in the last 168 hours. Cardiac Enzymes: Recent Labs  Lab 01/29/17 1313 01/29/17 1607 01/30/17 0017 01/30/17 0538 01/30/17 1120  TROPONINI <0.03 0.06* 0.05* 0.05* 0.03*   BNP (last 3 results) No results for input(s): BNP in the last 8760 hours. CBG: No results for input(s): GLUCAP in the last 168 hours. Time spent: 35 minutes  Signed:  Berle Mull  [facility]  01/31/2017  , 9:55 AM

## 2017-01-31 NOTE — Discharge Summary (Signed)
[facility] Discharge Summary   Patient: Stephen [name] PZW:[id number]   PCP: Charolette Forward, MD DOB: 03-Nov-1965   Date of admission: 01/29/2017   Date of discharge:  01/31/2017    Discharge Diagnoses:  Principal Problem:   Chest pain Active Problems:   Essential hypertension   Hyperlipidemia   Right renal mass   Elevated troponin   Unstable angina ([name])   Admitted From: [facility]  Disposition:  [place] facility   Recommendations for Outpatient Follow-up:  1. Please follow up with PCP in 2 weeks. 2. Please call [facility], number given bellow to setup an appointment for right renal mass, needs to establish care for potential malignancy and probable operative management    Follow-up Information    Charolette Forward, MD. Schedule an appointment as soon as possible for a visit in 2 week(s).   Specialty:  Cardiology Contact information: [place] [address] [phone]        [facility]. Call on 02/01/2017.   Why:  need to setup care for right kidney mass. please call this number for follow up. Discussed with Dr Matilde Sprang. Contact information: [place] [phone]         Diet recommendation: cardiac diet  Activity: The patient is advised to gradually reintroduce usual activities.  Discharge Condition: good  Code Status: full code  History of present illness: As per the H and P dictated on admission, "Stephen [name] is a 51 y.o. male with medical history significant of CVA; HTN; DM; CAD; possible multiple psychiatric diagnoses; and possible recent diagnosis of renal cancer presenting with chest pain.  He is incarcerated and is accompanied by jail personnel.  He reports that since August, he has had chest pain and has been in and out of the hospitals.  During the last week of August, he had a heart issue and was seen in [place].  In September/October, he had another episode and was airlifted to [facility].  This last Thursday, he had another episode and was sent to [facility].  He had another episode today.  While he was having an Echo last Thursday, they diagnosed gallstones and also a kidney cancer.    He felt like he was having a seizure today - he felt like his whole body was locking up and a 600 pound person was sitting in the middle of his chest. +SOB, spots in his vision.  He just came back from lunch and was feeling bad; he could feel the pressure starting and so laid down and the pain got worse.  He took a NTG x 2 with some relief.  It helped more when EMS gave him a third one.  The chest discomfort is still present but not as bad now.  Substernal chest pain.  He reports that his last stress test was 2 months ago in [facility]; it was nuclear stress test in Sept/Oct.  Denies h/o heart catheterization.    ED Course:  Chest pain, + RF.  Appears stable.  CT confirms renal mass, likely neoplasm.  2nd troponin minimally increased to 0.06.  Cardiology in [place] recommends transfer to [facility] for cardiology evaluation due to lack of available cardiology support here at [facility] this week."  Hospital Course:  Summary of his active problems in the hospital is as following. Chest pain-unstable angina -Patient with substernal chest pressure thatcame on today, unclear if exertional, relieved with  NTG. -2-3/3 typical symptoms suggestive oftypicalcardiac chest pain. -Additionally, troponin has increased from negative to minimally detectable at 0.06. -Chest pain is resolved -His EKG shows what appears to be an early repolarization abnormality -Given the lack of available cardiology services at [facility] on the holidays, patient was transfer patient to [facility] SDU.  -TIMI score is 4, indicating a risk of mortality, MI, or severe recurrent ischemia requiring urgent revascularization within the next 14 days of 19.9%. -GRACE score is75; which  predicts an in-hospital death rate of 0.4%.  -ContinueASA 81 mg daily -Cardiology consulted, patient underwent cardiac catheterization and found to have RCA chronic total occlusion with good collaterals and recommendation was to medically manage the patient. Because of potential upcoming surgery for renal neoplasm and need for potential biopsy, I will hold off on clopidogrel administration If surgery is needed in the future he may proceed from cardiac perspective, moderate risk. Echocardiogram shows preserved EF without any significant wall motion abnormalities as well.  HTN -Takeslisinopril and lopressorat home, continue.   HLD -Change Zocor toLipitor  Renal mass -Patient reported recent diagnosis of renal cancer -Discussed with urology on-call, recommend outpatient follow-up once patient is stable. Number provided on discharge papers, need to establish care.   Gallstones, asymptomatic   Continue to monitor as outpatient.  GERD Added protonix, removed prilosec.   All other chronic medical condition were stable during the hospitalization.  Patient was ambulatory without any assistance. On the day of the discharge the patient's vitals were stable, and no other acute medical condition were reported by patient. the patient was felt safe to be discharge [facility].  Procedures and Results:  Echocardiogram   Cardiac cath   Consultations:  Cardiology  On call urology Dr Matilde Sprang.  DISCHARGE MEDICATION: Allergies as of 01/31/2017   No Known Allergies     Medication List    STOP taking these medications   omeprazole 20 MG capsule Commonly known as:  PRILOSEC Replaced by:  pantoprazole 40 MG tablet   simvastatin 20 MG tablet Commonly known as:  ZOCOR     TAKE these medications   aspirin EC 81 MG tablet Take 81 mg by mouth daily.   atorvastatin 40 MG tablet Commonly known as:  LIPITOR Take 1 tablet (40 mg total) by mouth daily at 6 PM.     docusate sodium 100 MG capsule Commonly known as:  COLACE Take 200 mg by mouth daily.   lisinopril 10 MG tablet Commonly known as:  PRINIVIL,ZESTRIL Take 20 mg by mouth daily.   metoprolol tartrate 25 MG tablet Commonly known as:  LOPRESSOR Take 25 mg by mouth 2 (two) times daily.   nitroGLYCERIN 0.4 MG SL tablet Commonly known as:  NITROSTAT Place 0.4 mg under the tongue every 5 (five) minutes as needed for chest pain.   pantoprazole 40 MG tablet Commonly known as:  PROTONIX Take 1 tablet (40 mg total) by mouth daily before breakfast. Replaces:  omeprazole 20 MG capsule      No Known Allergies Discharge Instructions    Diet - low sodium heart healthy   Complete by:  As directed    Increase activity slowly   Complete by:  As directed      Discharge Exam: Filed Weights   01/29/17 2301 01/30/17 0455 01/31/17 0423  Weight: 81 kg (178 lb 9.2 oz) 81.2 kg (179 lb 0.2 oz) 79.1 kg (174 lb 4.8 oz)   Vitals:   01/31/17 0423 01/31/17 0733  BP: (!) 145/82 (!) 149/87  Pulse: (!) 57 (!) 56  Resp: 16 20  Temp: 98.8 F (37.1 C) 98.4 F (36.9 C)  SpO2: 98% 98%   General: Appear in no distress, no Rash; Oral Mucosa moist. Cardiovascular: S1 and S2 Present, no Murmur, no JVD Respiratory: Bilateral Air entry present and Clear to Auscultation, no Crackles, no wheezes Abdomen: Bowel Sound present, Soft and no tenderness Extremities: no Pedal edema, no calf tenderness Neurology: Grossly no focal neuro deficit.  The results of significant diagnostics from this hospitalization (including imaging, microbiology, ancillary and laboratory) are listed below for reference.    Significant Diagnostic Studies: Ct Abdomen Pelvis W Contrast  Result Date: 01/29/2017 CLINICAL DATA:  Abdominal pain, chest pain EXAM: CT ABDOMEN AND PELVIS WITH CONTRAST TECHNIQUE: Multidetector CT imaging of the abdomen and pelvis was performed using the standard protocol following bolus administration of  intravenous contrast. CONTRAST:  147mL ISOVUE-300 IOPAMIDOL (ISOVUE-300) INJECTION 61% COMPARISON:  06/03/2004 FINDINGS: Lower chest: Heart is mildly enlarged. No focal airspace opacity or effusions. Hepatobiliary: Multiple gallstones within the gallbladder. No focal hepatic abnormality. Pancreas: No focal abnormality or ductal dilatation. Spleen: No focal abnormality.  Normal size. Adrenals/Urinary Tract: Complex large mixed cystic and solid lesion in the lower pole of the right kidney measuring 4.1 x 4.0 cm. Thick internal enhancing septations. Findings concerning for cystic renal neoplasm. No hydronephrosis. Adrenal glands and urinary bladder unremarkable. Stomach/Bowel: Stomach, large and small bowel grossly unremarkable. Vascular/Lymphatic: Aortic and iliac calcifications. No evidence of aneurysm or adenopathy. Reproductive: No visible focal abnormality. Other: No free fluid or free air. Musculoskeletal: No acute bony abnormality or focal bone lesion. IMPRESSION: 4.1 x 4.0 cm complex cystic mass in the lower pole of the right kidney with thick internal enhancing septations. Findings concerning for cystic renal neoplasm. Cholelithiasis. Aortoiliac atherosclerosis. Borderline cardiomegaly. Electronically Signed   By: Rolm Baptise M.D.   On: 01/29/2017 14:40   Dg Chest Portable 1 View  Result Date: 01/29/2017 CLINICAL DATA:  51 year old male with chest pain EXAM: PORTABLE CHEST 1 VIEW COMPARISON:  Prior chest x-ray 07/30/2011 FINDINGS: The lungs are clear and negative for focal airspace consolidation, pulmonary edema or suspicious pulmonary nodule. No pleural effusion or pneumothorax. Cardiac and mediastinal contours are within normal limits. No acute fracture or lytic or blastic osseous lesions. The visualized upper abdominal bowel gas pattern is unremarkable. IMPRESSION: Negative chest x-ray. Electronically Signed   By: Jacqulynn Cadet M.D.   On: 01/29/2017 13:40    Microbiology: Recent Results (from  the past 240 hour(s))  MRSA PCR Screening     Status: None   Collection Time: 01/30/17  1:20 AM  Result Value Ref Range Status   MRSA by PCR NEGATIVE NEGATIVE Final    Comment:        The GeneXpert MRSA Assay (FDA approved for NASAL specimens only), is one component of a comprehensive MRSA colonization surveillance program. It is not intended to diagnose MRSA infection nor to guide or monitor treatment for MRSA infections.      Labs: CBC: Recent Labs  Lab 01/29/17 1313 01/30/17 0538 01/31/17 0347  WBC 4.6 6.1 5.4  NEUTROABS 2.9  --   --   HGB 14.1 14.7 13.7  HCT 42.9 43.1 39.9  MCV 92.5 91.5 91.5  PLT 116* 114* 094*   Basic Metabolic Panel: Recent Labs  Lab 01/29/17 1313 01/30/17 0538 01/31/17 0347  NA 137 137 138  K 4.2 4.3 3.7  CL 100* 100* 103  CO2 29 29 28   GLUCOSE 100*  94 102*  BUN 11 9 11   CREATININE 1.23 1.25* 1.18  CALCIUM 9.3 9.1 8.8*   Liver Function Tests: No results for input(s): AST, ALT, ALKPHOS, BILITOT, PROT, ALBUMIN in the last 168 hours. Recent Labs  Lab 01/29/17 1342  LIPASE 19   No results for input(s): AMMONIA in the last 168 hours. Cardiac Enzymes: Recent Labs  Lab 01/29/17 1313 01/29/17 1607 01/30/17 0017 01/30/17 0538 01/30/17 1120  TROPONINI <0.03 0.06* 0.05* 0.05* 0.03*   BNP (last 3 results) No results for input(s): BNP in the last 8760 hours. CBG: No results for input(s): GLUCAP in the last 168 hours. Time spent: 35 minutes  Signed:  Berle Mull  [facility]  01/31/2017  , 9:39 AM

## 2017-01-31 NOTE — Progress Notes (Signed)
Pt discharged back to correctional facility. PIV removed, AVS reviewed with pt, given to guard. Pt left unit via wheelchair accompanied by 2 guards.

## 2017-01-31 NOTE — Progress Notes (Addendum)
Progress Note  Patient Name: Stephen [name] Date of Encounter: 01/31/2017  Primary Cardiologist: No primary care provider on file. New - Stephen [name]  Subjective   No further chest pain, no shortness of breath.  Catheterization revealed CTO of right coronary artery with good collaterals.  Inpatient Medications    Scheduled Meds: . aspirin EC  81 mg Oral Daily  . atorvastatin  40 mg Oral q1800  . lisinopril  20 mg Oral Daily  . metoprolol tartrate  25 mg Oral BID  . nitroGLYCERIN  0.5 inch Topical Q6H  . pantoprazole  40 mg Oral Daily  . sodium chloride flush  3 mL Intravenous Q12H  . sodium chloride flush  3 mL Intravenous Q12H   Continuous Infusions: . sodium chloride    . sodium chloride     PRN Meds: sodium chloride, sodium chloride, acetaminophen, morphine injection, ondansetron (ZOFRAN) IV, sodium chloride flush, sodium chloride flush   Vital Signs    Vitals:   01/30/17 2027 01/30/17 2151 01/31/17 0030 01/31/17 0423  BP: 134/76  119/62 (!) 145/82  Pulse: 60 61 (!) 59 (!) 57  Resp: 14  16 16   Temp: 99.7 F (37.6 C)  99.1 F (37.3 C) 98.8 F (37.1 C)  TempSrc: Oral  Oral Oral  SpO2: 100%  98% 98%  Weight:    174 lb 4.8 oz (79.1 kg)  Height:        Intake/Output Summary (Last 24 hours) at 01/31/2017 0713 Last data filed at 01/31/2017 0435 Gross per 24 hour  Intake 2662.56 ml  Output 1650 ml  Net 1012.56 ml   Filed Weights   01/29/17 2301 01/30/17 0455 01/31/17 0423  Weight: 178 lb 9.2 oz (81 kg) 179 lb 0.2 oz (81.2 kg) 174 lb 4.8 oz (79.1 kg)    Telemetry    Sinus rhythm/sinus bradycardia no other abnormalities.- Personally Reviewed  ECG    EKG, J-point elevation, LVH- Personally Reviewed  Physical Exam   GEN: No acute distress.   Neck: No JVD Cardiac: RRR, no murmurs, rubs, or gallops.  Groin site normal.  Post cath. Respiratory: Clear to auscultation bilaterally. GI: Soft, nontender, non-distended  MS: No edema; No deformity. Neuro:   Nonfocal  Psych: Normal affect   Labs    Chemistry Recent Labs  Lab 01/29/17 1313 01/30/17 0538 01/31/17 0347  NA 137 137 138  K 4.2 4.3 3.7  CL 100* 100* 103  CO2 29 29 28   GLUCOSE 100* 94 102*  BUN 11 9 11   CREATININE 1.23 1.25* 1.18  CALCIUM 9.3 9.1 8.8*  GFRNONAA >60 >60 >60  GFRAA >60 >60 >60  ANIONGAP 8 8 7      Hematology Recent Labs  Lab 01/29/17 1313 01/30/17 0538 01/31/17 0347  WBC 4.6 6.1 5.4  RBC 4.64 4.71 4.36  HGB 14.1 14.7 13.7  HCT 42.9 43.1 39.9  MCV 92.5 91.5 91.5  MCH 30.4 31.2 31.4  MCHC 32.9 34.1 34.3  RDW 13.2 13.2 13.1  PLT 116* 114* 108*    Cardiac Enzymes Recent Labs  Lab 01/29/17 1607 01/30/17 0017 01/30/17 0538 01/30/17 1120  TROPONINI 0.06* 0.05* 0.05* 0.03*    Recent Labs  Lab 01/29/17 1321  TROPIPOC 0.00     BNPNo results for input(s): BNP, PROBNP in the last 168 hours.   DDimer  Recent Labs  Lab 01/29/17 1342  DDIMER 0.34     Radiology    Ct Abdomen Pelvis W Contrast  Result Date: 01/29/2017 CLINICAL DATA:  Abdominal pain, chest pain EXAM: CT ABDOMEN AND PELVIS WITH CONTRAST TECHNIQUE: Multidetector CT imaging of the abdomen and pelvis was performed using the standard protocol following bolus administration of intravenous contrast. CONTRAST:  11mL ISOVUE-300 IOPAMIDOL (ISOVUE-300) INJECTION 61% COMPARISON:  06/03/2004 FINDINGS: Lower chest: Heart is mildly enlarged. No focal airspace opacity or effusions. Hepatobiliary: Multiple gallstones within the gallbladder. No focal hepatic abnormality. Pancreas: No focal abnormality or ductal dilatation. Spleen: No focal abnormality.  Normal size. Adrenals/Urinary Tract: Complex large mixed cystic and solid lesion in the lower pole of the right kidney measuring 4.1 x 4.0 cm. Thick internal enhancing septations. Findings concerning for cystic renal neoplasm. No hydronephrosis. Adrenal glands and urinary bladder unremarkable. Stomach/Bowel: Stomach, large and small bowel grossly  unremarkable. Vascular/Lymphatic: Aortic and iliac calcifications. No evidence of aneurysm or adenopathy. Reproductive: No visible focal abnormality. Other: No free fluid or free air. Musculoskeletal: No acute bony abnormality or focal bone lesion. IMPRESSION: 4.1 x 4.0 cm complex cystic mass in the lower pole of the right kidney with thick internal enhancing septations. Findings concerning for cystic renal neoplasm. Cholelithiasis. Aortoiliac atherosclerosis. Borderline cardiomegaly. Electronically Signed   By: Rolm Baptise M.D.   On: 01/29/2017 14:40   Dg Chest Portable 1 View  Result Date: 01/29/2017 CLINICAL DATA:  51 year old male with chest pain EXAM: PORTABLE CHEST 1 VIEW COMPARISON:  Prior chest x-ray 07/30/2011 FINDINGS: The lungs are clear and negative for focal airspace consolidation, pulmonary edema or suspicious pulmonary nodule. No pleural effusion or pneumothorax. Cardiac and mediastinal contours are within normal limits. No acute fracture or lytic or blastic osseous lesions. The visualized upper abdominal bowel gas pattern is unremarkable. IMPRESSION: Negative chest x-ray. Electronically Signed   By: Jacqulynn Cadet M.D.   On: 01/29/2017 13:40    Cardiac Studies  Cardiac catheterization February 10, 2017:  Prox RCA to Mid RCA lesion is 100% stenosed.  The left ventricular systolic function is normal.  LV end diastolic pressure is normal.  The left ventricular ejection fraction is 55-65% by visual estimate.    IMPRESSION: Stephen [name] has an occluded dominant RCA which appears to be a "CTO" with grade 2 left-to-right collaterals and normal LV function.  Medical therapy is indicated at this time.  I performed a right common femoral angiogram and then [facility] closed his right common femoral puncture site achieving excellent hemostasis.  The patient left the lab in stable condition.  Diagnostic Diagram       Patient Profile     51 y.o. male with coronary artery disease, complete total  occlusion of right coronary artery with excellent collateral blood flow, no PCI performed during cardiac catheterization on 02/10/17, normal ejection fraction, minimally elevated flat troponin consistent with demand ischemia type II myocardial infarction, renal mass concerning for renal neoplasm, gallstones, currently serving prison time.  Assessment & Plan    Coronary artery disease - CTO of right coronary artery with excellent collateral blood flow from left to right.  Continue with medical management.  Normal ejection fraction.  Continue with aspirin 81, metoprolol 25 mg twice a day, atorvastatin 40 mg a day, lisinopril 20 mg a day.  Elevated troponin consistent with type II myocardial infarction -Minimally elevated troponin of 0.06 with chest discomfort and catheterization demonstrating occlusion of right coronary artery that has been chronic with left to right collaterals.  Demand ischemia.  Continue with medical management.  Because of potential upcoming surgery for renal neoplasm and need for potential biopsy, I will hold off on clopidogrel administration.  Renal mass -Concerning for neoplasm.  See CT scan for full details.  He stated previously that this had been diagnosed at [facility].  Will require outpatient urology follow-up. If surgery is needed in the future he may proceed from cardiac perspective, moderate risk.  Aortic atherosclerosis -Statin, aspirin.  Gallstones - Some of the symptoms previously appeared somewhat colicky in nature.  Continue to monitor as outpatient.  Okay with discharge from cardiology perspective.     For questions or updates, please contact [place] Please consult [email or web address] for contact info under Cardiology/STEMI.      Signed, Candee Furbish, MD  01/31/2017, 7:13 AM

## 2017-02-01 ENCOUNTER — Encounter (HOSPITAL_COMMUNITY): Payer: Self-pay | Admitting: Cardiovascular Disease

## 2017-02-07 ENCOUNTER — Emergency Department (HOSPITAL_COMMUNITY)
Admission: EM | Admit: 2017-02-07 | Discharge: 2017-02-07 | Disposition: A | Discharge status: Home / Self Care | Attending: Emergency Medicine | Admitting: Emergency Medicine

## 2017-02-07 ENCOUNTER — Other Ambulatory Visit: Payer: Self-pay

## 2017-02-07 ENCOUNTER — Encounter (HOSPITAL_COMMUNITY): Payer: Self-pay | Admitting: Emergency Medicine

## 2017-02-07 ENCOUNTER — Emergency Department (HOSPITAL_COMMUNITY)

## 2017-02-07 DIAGNOSIS — R079 Chest pain, unspecified: Secondary | ICD-10-CM | POA: Diagnosis present

## 2017-02-07 DIAGNOSIS — Z7982 Long term (current) use of aspirin: Secondary | ICD-10-CM | POA: Insufficient documentation

## 2017-02-07 DIAGNOSIS — Z87891 Personal history of nicotine dependence: Secondary | ICD-10-CM | POA: Diagnosis not present

## 2017-02-07 DIAGNOSIS — I259 Chronic ischemic heart disease, unspecified: Secondary | ICD-10-CM | POA: Diagnosis not present

## 2017-02-07 DIAGNOSIS — Z79899 Other long term (current) drug therapy: Secondary | ICD-10-CM | POA: Insufficient documentation

## 2017-02-07 DIAGNOSIS — I10 Essential (primary) hypertension: Secondary | ICD-10-CM | POA: Insufficient documentation

## 2017-02-07 LAB — CBC
HCT: 45.5 % (ref 39.0–52.0)
HEMOGLOBIN: 15.1 g/dL (ref 13.0–17.0)
MCH: 30.5 pg (ref 26.0–34.0)
MCHC: 33.2 g/dL (ref 30.0–36.0)
MCV: 91.9 fL (ref 78.0–100.0)
Platelets: 164 10*3/uL (ref 150–400)
RBC: 4.95 MIL/uL (ref 4.22–5.81)
RDW: 13.1 % (ref 11.5–15.5)
WBC: 4.7 10*3/uL (ref 4.0–10.5)

## 2017-02-07 LAB — BASIC METABOLIC PANEL
ANION GAP: 9 (ref 5–15)
BUN: 10 mg/dL (ref 6–20)
CHLORIDE: 101 mmol/L (ref 101–111)
CO2: 29 mmol/L (ref 22–32)
CREATININE: 1.07 mg/dL (ref 0.61–1.24)
Calcium: 9.2 mg/dL (ref 8.9–10.3)
GFR calc non Af Amer: >60 mL/min (ref >60)
GLUCOSE: 98 mg/dL (ref 65–99)
Potassium: 3.7 mmol/L (ref 3.5–5.1)
Sodium: 139 mmol/L (ref 135–145)

## 2017-02-07 LAB — I-STAT TROPONIN, ED
Troponin i, poc: 0 ng/mL (ref 0.00–0.08)
Troponin i, poc: 0.01 ng/mL (ref 0.00–0.08)

## 2017-02-07 NOTE — ED Triage Notes (Signed)
Pt c/o of cp starting two hours ago.  Cp in center of chest, non radiating. Pt had two nitros at facility and one in route with EMS.  EMS states elevation in V2 and V3 leads on their monitor. [facility] for a catheterization.
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
## 2017-02-07 NOTE — Discharge Instructions (Signed)
Your chemicals for a heart attack were negative on 2 different occasions today.  I reviewed your cardiac catheterization from last week.  You have a blockage of your right coronary artery.  The cardiologist recommended medical management at this time.  You can take 3 doses of nitroglycerin approximately 5-10 minutes apart for your chest pain.  Follow-up with the cardiologist in [facility]

## 2017-04-01 ENCOUNTER — Other Ambulatory Visit: Payer: Self-pay

## 2017-04-01 ENCOUNTER — Emergency Department (HOSPITAL_COMMUNITY)

## 2017-04-01 ENCOUNTER — Observation Stay (HOSPITAL_COMMUNITY)
Admission: EM | Admit: 2017-04-01 | Discharge: 2017-04-04 | Disposition: A | Discharge status: Home / Self Care | Attending: Internal Medicine | Admitting: Internal Medicine

## 2017-04-01 ENCOUNTER — Encounter (HOSPITAL_COMMUNITY): Payer: Self-pay

## 2017-04-01 DIAGNOSIS — R61 Generalized hyperhidrosis: Secondary | ICD-10-CM | POA: Insufficient documentation

## 2017-04-01 DIAGNOSIS — Z79899 Other long term (current) drug therapy: Secondary | ICD-10-CM | POA: Diagnosis not present

## 2017-04-01 DIAGNOSIS — Z87891 Personal history of nicotine dependence: Secondary | ICD-10-CM | POA: Insufficient documentation

## 2017-04-01 DIAGNOSIS — R079 Chest pain, unspecified: Secondary | ICD-10-CM | POA: Diagnosis present

## 2017-04-01 DIAGNOSIS — R404 Transient alteration of awareness: Secondary | ICD-10-CM

## 2017-04-01 DIAGNOSIS — I2510 Atherosclerotic heart disease of native coronary artery without angina pectoris: Secondary | ICD-10-CM | POA: Diagnosis not present

## 2017-04-01 DIAGNOSIS — R569 Unspecified convulsions: Secondary | ICD-10-CM | POA: Diagnosis not present

## 2017-04-01 DIAGNOSIS — R55 Syncope and collapse: Secondary | ICD-10-CM | POA: Diagnosis not present

## 2017-04-01 DIAGNOSIS — Z7982 Long term (current) use of aspirin: Secondary | ICD-10-CM | POA: Diagnosis not present

## 2017-04-01 DIAGNOSIS — Z8673 Personal history of transient ischemic attack (TIA), and cerebral infarction without residual deficits: Secondary | ICD-10-CM | POA: Diagnosis not present

## 2017-04-01 DIAGNOSIS — I10 Essential (primary) hypertension: Secondary | ICD-10-CM | POA: Diagnosis present

## 2017-04-01 DIAGNOSIS — Z859 Personal history of malignant neoplasm, unspecified: Secondary | ICD-10-CM | POA: Diagnosis not present

## 2017-04-01 DIAGNOSIS — K219 Gastro-esophageal reflux disease without esophagitis: Secondary | ICD-10-CM

## 2017-04-01 DIAGNOSIS — W3400XA Accidental discharge from unspecified firearms or gun, initial encounter: Secondary | ICD-10-CM

## 2017-04-01 DIAGNOSIS — E119 Type 2 diabetes mellitus without complications: Secondary | ICD-10-CM | POA: Diagnosis not present

## 2017-04-01 DIAGNOSIS — R4182 Altered mental status, unspecified: Secondary | ICD-10-CM

## 2017-04-01 DIAGNOSIS — R0602 Shortness of breath: Secondary | ICD-10-CM | POA: Insufficient documentation

## 2017-04-01 DIAGNOSIS — E785 Hyperlipidemia, unspecified: Secondary | ICD-10-CM | POA: Diagnosis present

## 2017-04-01 DIAGNOSIS — I200 Unstable angina: Secondary | ICD-10-CM | POA: Diagnosis not present

## 2017-04-01 HISTORY — DX: Gastro-esophageal reflux disease without esophagitis: K21.9

## 2017-04-01 HISTORY — DX: Altered mental status, unspecified: R41.82

## 2017-04-01 LAB — CBC
HCT: 39.2 % (ref 39.0–52.0)
HCT: 41.4 % (ref 39.0–52.0)
Hemoglobin: 13.4 g/dL (ref 13.0–17.0)
Hemoglobin: 13.9 g/dL (ref 13.0–17.0)
MCH: 30.9 pg (ref 26.0–34.0)
MCH: 31 pg (ref 26.0–34.0)
MCHC: 34.2 g/dL (ref 30.0–36.0)
MCHC: 33.6 g/dL (ref 30.0–36.0)
MCV: 90.5 fL (ref 78.0–100.0)
MCV: 92.2 fL (ref 78.0–100.0)
PLATELETS: 119 10*3/uL — AB (ref 150–400)
PLATELETS: 133 10*3/uL — AB (ref 150–400)
RBC: 4.33 MIL/uL (ref 4.22–5.81)
RBC: 4.49 MIL/uL (ref 4.22–5.81)
RDW: 12.7 % (ref 11.5–15.5)
RDW: 13.2 % (ref 11.5–15.5)
WBC: 3 10*3/uL — ABNORMAL LOW (ref 4.0–10.5)
WBC: 4.8 10*3/uL (ref 4.0–10.5)

## 2017-04-01 LAB — HEPATIC FUNCTION PANEL
ALK PHOS: 53 U/L (ref 38–126)
ALT: 20 U/L (ref 17–63)
AST: 32 U/L (ref 15–41)
Albumin: 3.5 g/dL (ref 3.5–5.0)
BILIRUBIN TOTAL: 0.3 mg/dL (ref 0.3–1.2)
Bilirubin, Direct: 0.1 mg/dL (ref 0.1–0.5)
Indirect Bilirubin: 0.2 mg/dL — ABNORMAL LOW (ref 0.3–0.9)
Total Protein: 6.8 g/dL (ref 6.5–8.1)

## 2017-04-01 LAB — DIFFERENTIAL
Basophils Absolute: 0 10*3/uL (ref 0.0–0.1)
Basophils Relative: 0 %
EOS ABS: 0 10*3/uL (ref 0.0–0.7)
EOS PCT: 1 %
LYMPHS ABS: 1.1 10*3/uL (ref 0.7–4.0)
Lymphocytes Relative: 37 %
MONO ABS: 0.4 10*3/uL (ref 0.1–1.0)
MONOS PCT: 12 %
NEUTROS PCT: 50 %
Neutro Abs: 1.5 10*3/uL — ABNORMAL LOW (ref 1.7–7.7)

## 2017-04-01 LAB — BASIC METABOLIC PANEL
Anion gap: 19 — ABNORMAL HIGH (ref 5–15)
BUN: 7 mg/dL (ref 6–20)
CHLORIDE: 96 mmol/L — AB (ref 101–111)
CO2: 23 mmol/L (ref 22–32)
CREATININE: 1.21 mg/dL (ref 0.61–1.24)
Calcium: 8.5 mg/dL — ABNORMAL LOW (ref 8.9–10.3)
GFR calc non Af Amer: >60 mL/min (ref >60)
Glucose, Bld: 111 mg/dL — ABNORMAL HIGH (ref 65–99)
Potassium: 4 mmol/L (ref 3.5–5.1)
Sodium: 138 mmol/L (ref 135–145)

## 2017-04-01 LAB — CREATININE, SERUM
Creatinine, Ser: 1.08 mg/dL (ref 0.61–1.24)
GFR calc Af Amer: >60 mL/min (ref >60)
GFR calc non Af Amer: >60 mL/min (ref >60)

## 2017-04-01 LAB — I-STAT TROPONIN, ED: Troponin i, poc: 0.01 ng/mL (ref 0.00–0.08)

## 2017-04-01 LAB — TROPONIN I: TROPONIN I: 0.23 ng/mL — AB (ref <0.03)

## 2017-04-01 LAB — MRSA PCR SCREENING: MRSA BY PCR: NEGATIVE

## 2017-04-01 MED ORDER — MORPHINE SULFATE (PF) 4 MG/ML IV SOLN: 2.0000 mg | INTRAVENOUS | Status: DC | PRN

## 2017-04-01 MED ORDER — PANTOPRAZOLE SODIUM 40 MG PO TBEC
40.0000 mg | DELAYED_RELEASE_TABLET | Freq: Every day | ORAL | Status: DC
  Administered 2017-04-02 – 2017-04-04 (×3): 40 mg via ORAL
  Filled 2017-04-01 (×3): qty 1

## 2017-04-01 MED ORDER — METOPROLOL TARTRATE 25 MG PO TABS
25.0000 mg | ORAL_TABLET | Freq: Two times a day (BID) | ORAL | Status: DC
  Administered 2017-04-01 – 2017-04-04 (×6): 25 mg via ORAL
  Filled 2017-04-01 (×6): qty 1

## 2017-04-01 MED ORDER — DOCUSATE SODIUM 100 MG PO CAPS
200.0000 mg | ORAL_CAPSULE | Freq: Every day | ORAL | Status: DC
  Administered 2017-04-01 – 2017-04-03 (×2): 200 mg via ORAL
  Filled 2017-04-01 (×4): qty 2

## 2017-04-01 MED ORDER — NITROGLYCERIN 0.4 MG SL SUBL: 0.4000 mg | SUBLINGUAL_TABLET | SUBLINGUAL | Status: DC | PRN

## 2017-04-01 MED ORDER — HYDROCHLOROTHIAZIDE 25 MG PO TABS
25.0000 mg | ORAL_TABLET | Freq: Every day | ORAL | Status: DC
  Administered 2017-04-01 – 2017-04-04 (×4): 25 mg via ORAL
  Filled 2017-04-01 (×4): qty 1

## 2017-04-01 MED ORDER — GI COCKTAIL ~~LOC~~: 30.0000 mL | Freq: Four times a day (QID) | ORAL | Status: DC | PRN

## 2017-04-01 MED ORDER — LISINOPRIL 10 MG PO TABS
10.0000 mg | ORAL_TABLET | Freq: Every day | ORAL | Status: DC
  Administered 2017-04-01 – 2017-04-04 (×4): 10 mg via ORAL
  Filled 2017-04-01 (×4): qty 1

## 2017-04-01 MED ORDER — MORPHINE SULFATE (PF) 4 MG/ML IV SOLN
4.0000 mg | Freq: Once | INTRAVENOUS | Status: AC
  Administered 2017-04-01: 4 mg via INTRAVENOUS
  Filled 2017-04-01: qty 1

## 2017-04-01 MED ORDER — ATORVASTATIN CALCIUM 40 MG PO TABS
40.0000 mg | ORAL_TABLET | Freq: Every day | ORAL | Status: DC
  Administered 2017-04-01 – 2017-04-03 (×3): 40 mg via ORAL
  Filled 2017-04-01 (×3): qty 1

## 2017-04-01 MED ORDER — ACETAMINOPHEN 325 MG PO TABS: 650.0000 mg | ORAL_TABLET | ORAL | Status: DC | PRN

## 2017-04-01 MED ORDER — ASPIRIN EC 81 MG PO TBEC
81.0000 mg | DELAYED_RELEASE_TABLET | Freq: Every day | ORAL | Status: DC
  Administered 2017-04-02 – 2017-04-04 (×3): 81 mg via ORAL
  Filled 2017-04-01 (×3): qty 1

## 2017-04-01 MED ORDER — ENOXAPARIN SODIUM 40 MG/0.4ML ~~LOC~~ SOLN
40.0000 mg | SUBCUTANEOUS | Status: DC
  Administered 2017-04-01 – 2017-04-03 (×3): 40 mg via SUBCUTANEOUS
  Filled 2017-04-01 (×3): qty 0.4

## 2017-04-01 MED ORDER — ONDANSETRON HCL 4 MG/2ML IJ SOLN: 4.0000 mg | Freq: Four times a day (QID) | INTRAMUSCULAR | Status: DC | PRN
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
## 2017-04-01 NOTE — ED Notes (Signed)
Attempted report x 2 

## 2017-04-01 NOTE — ED Triage Notes (Signed)
[facility] pt was [facility] right after pt now complaining of chest pain. Pt is supposed to have open heart surgery at some point soon, pt has 100% block of RCA. Pt has had 324mg  ASA and 3 nitros
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
## 2017-04-01 NOTE — ED Notes (Signed)
Patient transported to X-ray 

## 2017-04-01 NOTE — ED Notes (Signed)
Attempted report 

## 2017-04-02 DIAGNOSIS — W3400XA Accidental discharge from unspecified firearms or gun, initial encounter: Secondary | ICD-10-CM | POA: Diagnosis not present

## 2017-04-02 DIAGNOSIS — R404 Transient alteration of awareness: Secondary | ICD-10-CM | POA: Diagnosis not present

## 2017-04-02 DIAGNOSIS — R569 Unspecified convulsions: Secondary | ICD-10-CM | POA: Diagnosis not present

## 2017-04-02 DIAGNOSIS — D696 Thrombocytopenia, unspecified: Secondary | ICD-10-CM

## 2017-04-02 DIAGNOSIS — R55 Syncope and collapse: Principal | ICD-10-CM

## 2017-04-02 DIAGNOSIS — R748 Abnormal levels of other serum enzymes: Secondary | ICD-10-CM

## 2017-04-02 DIAGNOSIS — I5032 Chronic diastolic (congestive) heart failure: Secondary | ICD-10-CM

## 2017-04-02 DIAGNOSIS — K219 Gastro-esophageal reflux disease without esophagitis: Secondary | ICD-10-CM

## 2017-04-02 DIAGNOSIS — I200 Unstable angina: Secondary | ICD-10-CM | POA: Diagnosis not present

## 2017-04-02 DIAGNOSIS — E7800 Pure hypercholesterolemia, unspecified: Secondary | ICD-10-CM

## 2017-04-02 DIAGNOSIS — I10 Essential (primary) hypertension: Secondary | ICD-10-CM | POA: Diagnosis not present

## 2017-04-02 DIAGNOSIS — R4182 Altered mental status, unspecified: Secondary | ICD-10-CM | POA: Diagnosis not present

## 2017-04-02 LAB — CBC
HCT: 43 % (ref 39.0–52.0)
Hemoglobin: 14.8 g/dL (ref 13.0–17.0)
MCH: 31.7 pg (ref 26.0–34.0)
MCHC: 34.4 g/dL (ref 30.0–36.0)
MCV: 92.1 fL (ref 78.0–100.0)
PLATELETS: 120 10*3/uL — AB (ref 150–400)
RBC: 4.67 MIL/uL (ref 4.22–5.81)
RDW: 13 % (ref 11.5–15.5)
WBC: 4.1 10*3/uL (ref 4.0–10.5)

## 2017-04-02 LAB — LIPID PANEL
Cholesterol: 167 mg/dL (ref 0–200)
HDL: 41 mg/dL (ref >40)
LDL CALC: 99 mg/dL (ref 0–99)
Total CHOL/HDL Ratio: 4.1 RATIO
Triglycerides: 133 mg/dL (ref <150)
VLDL: 27 mg/dL (ref 0–40)

## 2017-04-02 LAB — BASIC METABOLIC PANEL
Anion gap: 11 (ref 5–15)
BUN: 8 mg/dL (ref 6–20)
CALCIUM: 9 mg/dL (ref 8.9–10.3)
CO2: 26 mmol/L (ref 22–32)
CREATININE: 1.15 mg/dL (ref 0.61–1.24)
Chloride: 100 mmol/L — ABNORMAL LOW (ref 101–111)
Glucose, Bld: 111 mg/dL — ABNORMAL HIGH (ref 65–99)
Potassium: 3.9 mmol/L (ref 3.5–5.1)
SODIUM: 137 mmol/L (ref 135–145)

## 2017-04-02 LAB — TROPONIN I: TROPONIN I: 0.2 ng/mL — AB (ref <0.03)

## 2017-04-02 MED ORDER — POLYETHYLENE GLYCOL 3350 17 G PO PACK
17.0000 g | PACK | Freq: Every day | ORAL | Status: DC
  Administered 2017-04-02: 17 g via ORAL
  Filled 2017-04-02 (×3): qty 1
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
## 2017-04-03 ENCOUNTER — Observation Stay (HOSPITAL_COMMUNITY)

## 2017-04-03 ENCOUNTER — Observation Stay (HOSPITAL_BASED_OUTPATIENT_CLINIC_OR_DEPARTMENT_OTHER)

## 2017-04-03 DIAGNOSIS — I10 Essential (primary) hypertension: Secondary | ICD-10-CM | POA: Diagnosis not present

## 2017-04-03 DIAGNOSIS — R55 Syncope and collapse: Secondary | ICD-10-CM | POA: Diagnosis not present

## 2017-04-03 DIAGNOSIS — I200 Unstable angina: Secondary | ICD-10-CM | POA: Diagnosis not present

## 2017-04-03 DIAGNOSIS — R4182 Altered mental status, unspecified: Secondary | ICD-10-CM | POA: Diagnosis not present

## 2017-04-03 LAB — ECHOCARDIOGRAM COMPLETE
HEIGHTINCHES: 73 in
WEIGHTICAEL: 2960 [oz_av]

## 2017-04-03 LAB — TROPONIN I: Troponin I: 0.08 ng/mL (ref <0.03)

## 2017-04-03 NOTE — Progress Notes (Signed)
EEG Completed; Results Pending  

## 2017-04-03 NOTE — Progress Notes (Signed)
  Echocardiogram 2D Echocardiogram has been performed.  Jennette Dubin 04/03/2017, 2:54 PM

## 2017-04-03 NOTE — Progress Notes (Signed)
PROGRESS NOTE  Stephen [name] SHF:[id number] DOB: July 24, 1965 DOA: 04/01/2017 PCP: System, Pcp Not In  HPI/Recap of past 24 hours: Stephen [name] is a 52 y.o. male with medical history significant coronany artery disease, GERD, bipolar disorder with multiple personality disorder, schizophrenia and anxiety, renal carcinoma, hypertension, stroke presented to [facility] for evaluation of chest pain. During the last 2 or 3 days has been experiencing shortness of breath along with chest pain/pressure radiating down to his left neck and left arm. Officer reports patient had an episode of tonic movement affecting upper and lower extremities lasting about 15 secs. Discharged on 01/31/2017 after his admission with chest pain. Found to have chronic total occlusion of the RCA with good collaterals and the recommendations were to have patient medically managed.  Admitted for ACS r/o ACS and reported new onset seizure.   04/02/17: Elevated troponin that are trending down. Cardiology called. Dr Radford Pax requests call back if echo abnormal or if seizure ruled out. Patient seen and examined with 2 police officers at bedside with 1 officer who reports witnessing tonic movements. Patient denies chest pain or dyspnea. He is alert and oriented x 3.  04/03/17: no new complaints.   Assessment/Plan: Principal Problem:   Unstable angina (HCC) Active Problems:   Essential hypertension   Hyperlipidemia   GERD (gastroesophageal reflux disease)   Altered mental status, unspecified  Chest pain r/o ACS -troponin 0.23 trending down 0.20 -EKG no ST T elevation -04/03/17 troponin peaked at 0.23 and trended down -cardiology consulted-requests call back if echo abnormal/seizures ruled out -04/03/17 continue telemetry monitoring  CAD with complete occlusion of RCA -04/03/17 continue asa, statin -2D echo done 04/03/17  Reported witnessed seizures -reported by police officer in the room of tonic movements  affecting upper and lower extremities -no prior hx of seizures -EEG no sign of seizure activity -consult neurology post EEG -seizure precaution -04/03/17 continue close monitoring  HLD -ldl 99 -04/03/17 continue statin  HTN -BP stable -continue home meds  Chronic diastolic heart failure EF 50-55% from cath 01/2017 -continue BB, hctz, Acei -not in excaerbation  GERD -stable -continue protonix  Chronic thrombocytopenia -stable -plt 120 -no sign of overt bleeding  Code Status: full   Family Communication: none at bedside  Disposition Plan: to be determined   Consultants:  cardiology   Procedures:  none  Antimicrobials:  none  DVT prophylaxis:  SCDs   Objective: Vitals:   04/02/17 2304 04/03/17 0418 04/03/17 0804 04/03/17 1414  BP: (!) 145/80 105/81 114/78 119/75  Pulse: 62 (!) 59 72 63  Resp:  20  20  Temp:  98 F (36.7 C)  98.1 F (36.7 C)  TempSrc:  Oral  Oral  SpO2:  100%  97%  Weight:      Height:        Intake/Output Summary (Last 24 hours) at 04/03/2017 1652 Last data filed at 04/03/2017 1200 Gross per 24 hour  Intake 1560 ml  Output 1925 ml  Net -365 ml   Filed Weights   04/01/17 1345 04/02/17 0555  Weight: 83.9 kg (185 lb) 83.9 kg (185 lb)    Exam: 04/03/17 patient seen and examined. Physical exam no change from prior   General:  52 yo AAM WDWN NAD A&O x3  Cardiovascular: RRR no rubs or gallops   Respiratory: CTA no wheezes or rales  Abdomen: soft NT ND NBS x4   Musculoskeletal: No focal abnormalities   Skin: No rash noted  Psychiatry: Mood is  appropriate for condition and setting.   Data Reviewed: CBC: Recent Labs  Lab 04/01/17 1356 04/01/17 1847 04/02/17 0623  WBC 3.0* 4.8 4.1  NEUTROABS 1.5*  --   --   HGB 13.4 13.9 14.8  HCT 39.2 41.4 43.0  MCV 90.5 92.2 92.1  PLT 119* 133* 664*   Basic Metabolic Panel: Recent Labs  Lab 04/01/17 1356 04/01/17 1847 04/02/17 0623  NA 138  --  137  K 4.0  --  3.9    CL 96*  --  100*  CO2 23  --  26  GLUCOSE 111*  --  111*  BUN 7  --  8  CREATININE 1.21 1.08 1.15  CALCIUM 8.5*  --  9.0   GFR: Estimated Creatinine Clearance: 84.9 mL/min (by C-G formula based on SCr of 1.15 mg/dL). Liver Function Tests: Recent Labs  Lab 04/01/17 1356  AST 32  ALT 20  ALKPHOS 53  BILITOT 0.3  PROT 6.8  ALBUMIN 3.5   No results for input(s): LIPASE, AMYLASE in the last 168 hours. No results for input(s): AMMONIA in the last 168 hours. Coagulation Profile: No results for input(s): INR, PROTIME in the last 168 hours. Cardiac Enzymes: Recent Labs  Lab 04/01/17 1847 04/02/17 0623 04/03/17 1131  TROPONINI 0.23* 0.20* 0.08*   BNP (last 3 results) No results for input(s): PROBNP in the last 8760 hours. HbA1C: No results for input(s): HGBA1C in the last 72 hours. CBG: No results for input(s): GLUCAP in the last 168 hours. Lipid Profile: Recent Labs    04/02/17 0623  CHOL 167  HDL 41  LDLCALC 99  TRIG 133  CHOLHDL 4.1   Thyroid Function Tests: No results for input(s): TSH, T4TOTAL, FREET4, T3FREE, THYROIDAB in the last 72 hours. Anemia Panel: No results for input(s): VITAMINB12, FOLATE, FERRITIN, TIBC, IRON, RETICCTPCT in the last 72 hours. Urine analysis:    Component Value Date/Time   COLORURINE YELLOW 07/23/2011 0044   APPEARANCEUR CLEAR 07/23/2011 0044   LABSPEC 1.036 (H) 07/23/2011 0044   PHURINE 5.0 07/23/2011 0044   GLUCOSEU NEGATIVE 07/23/2011 0044   HGBUR NEGATIVE 07/23/2011 0044   BILIRUBINUR SMALL (A) 07/23/2011 0044   KETONESUR TRACE (A) 07/23/2011 0044   PROTEINUR NEGATIVE 07/23/2011 0044   UROBILINOGEN 1.0 07/23/2011 0044   NITRITE NEGATIVE 07/23/2011 0044   LEUKOCYTESUR NEGATIVE 07/23/2011 0044   Sepsis Labs: @LABRCNTIP (procalcitonin:4,lacticidven:4)  ) Recent Results (from the past 240 hour(s))  MRSA PCR Screening     Status: None   Collection Time: 04/01/17  6:34 PM  Result Value Ref Range Status   MRSA by PCR  NEGATIVE NEGATIVE Final    Comment:        The GeneXpert MRSA Assay (FDA approved for NASAL specimens only), is one component of a comprehensive MRSA colonization surveillance program. It is not intended to diagnose MRSA infection nor to guide or monitor treatment for MRSA infections. Performed at [facility], [place] [address], [place], [place] [address]       Studies: No results found.  Scheduled Meds: . aspirin EC  81 mg Oral Daily  . atorvastatin  40 mg Oral q1800  . docusate sodium  200 mg Oral Daily  . enoxaparin (LOVENOX) injection  40 mg Subcutaneous Q24H  . hydrochlorothiazide  25 mg Oral Daily  . lisinopril  10 mg Oral Daily  . metoprolol tartrate  25 mg Oral BID  . pantoprazole  40 mg Oral QAC breakfast  . polyethylene glycol  17 g  Oral Daily    Continuous Infusions:   LOS: 0 days     Kayleen Memos, MD [facility] Pager [phone]  If 7PM-7AM, please contact night-coverage [email or web address] Password [facility] 04/03/2017, 4:52 PM

## 2017-04-03 NOTE — Care Management Note (Signed)
Case Management Note  Patient Details  Name: Stephen [name] MRN: [id number] Date of Birth: 07-15-1965  Subjective/Objective:   Pt presented for Unstable Angina. PTA from the [facility]. CM did call the [facility] @ [phone] and spoke with RN.                 Action/Plan: CM to fax information to [facility] @ [phone] once stable for d/c. No further needs from CM at this time.   Expected Discharge Date:                  Expected Discharge Plan:  [facility]  In-House Referral:  NA  Discharge planning Services  CM Consult  Post Acute Care Choice:  NA Choice offered to:  NA  DME Arranged:  N/A DME Agency:  NA  HH Arranged:  NA HH Agency:  NA  Status of Service:  Completed, signed off  If discussed at [place] of Stay Meetings, dates discussed:    Additional Comments:  Bethena Roys, RN 04/03/2017, 3:26 PM

## 2017-04-03 NOTE — Progress Notes (Signed)
Pt occasionally had tachy cardiac in 120s.  Pt was asymptomatic. Denies palpitations.  Idolina Primer, RN

## 2017-04-03 NOTE — Procedures (Signed)
ELECTROENCEPHALOGRAM REPORT  Date of Study: 04/03/2017  Patient's Name: Stephen [name] MRN: [id number] Date of Birth: Dec 01, 1965  Referring Provider: York Grice, PA-C  Clinical History: 52 year old male evaluated for seizures.  Medications: ASA Lipitor HCTZ Lisinopril Lopressor Morphine Zofran  Technical Summary: A multichannel digital EEG recording measured by the international 10-20 system with electrodes applied with paste and impedances below 5000 ohms performed in our laboratory with EKG monitoring in an awake and drowsy patient.  Hyperventilation was not performed.  Photic stimulation was performed.  The digital EEG was referentially recorded, reformatted, and digitally filtered in a variety of bipolar and referential montages for optimal display.    Description: The patient is awake and drowsy during the recording.  During maximal wakefulness, there is a symmetric, medium voltage 9 Hz posterior dominant rhythm that attenuates with eye opening.  The record is symmetric.  During drowsiness, there is an increase in theta slowing of the background.  Stage 2 sleep is not seen.  Photic stimulation did not elicit any abnormalities.  There were no epileptiform discharges or electrographic seizures seen.    EKG lead was unremarkable.  Impression: This awake and drowsy EEG is normal.    Clinical Correlation: A normal EEG does not exclude a clinical diagnosis of epilepsy.  If further clinical questions remain, prolonged EEG may be helpful.  Clinical correlation is advised.   Metta Clines, DO

## 2017-04-04 ENCOUNTER — Observation Stay (HOSPITAL_COMMUNITY)

## 2017-04-04 DIAGNOSIS — R569 Unspecified convulsions: Secondary | ICD-10-CM

## 2017-04-04 DIAGNOSIS — I200 Unstable angina: Secondary | ICD-10-CM | POA: Diagnosis not present

## 2017-04-04 DIAGNOSIS — I10 Essential (primary) hypertension: Secondary | ICD-10-CM | POA: Diagnosis not present

## 2017-04-04 DIAGNOSIS — W3400XA Accidental discharge from unspecified firearms or gun, initial encounter: Secondary | ICD-10-CM

## 2017-04-04 DIAGNOSIS — R4182 Altered mental status, unspecified: Secondary | ICD-10-CM | POA: Diagnosis not present

## 2017-04-04 DIAGNOSIS — R55 Syncope and collapse: Secondary | ICD-10-CM | POA: Diagnosis not present

## 2017-04-04 LAB — DRUG SCREEN 10 W/CONF, SERUM
AMPHETAMINES, IA: NEGATIVE ng/mL
BARBITURATES, IA: NEGATIVE ug/mL
BENZODIAZEPINES, IA: NEGATIVE ng/mL
COCAINE & METABOLITE, IA: NEGATIVE ng/mL
Methadone, IA: NEGATIVE ng/mL
OPIATES, IA: NEGATIVE ng/mL
Oxycodones, IA: NEGATIVE ng/mL
Phencyclidine, IA: NEGATIVE ng/mL
Propoxyphene, IA: NEGATIVE ng/mL
THC(Marijuana) Metabolite, IA: NEGATIVE ng/mL

## 2017-04-04 LAB — BASIC METABOLIC PANEL
Anion gap: 10 (ref 5–15)
BUN: 8 mg/dL (ref 6–20)
CHLORIDE: 99 mmol/L — AB (ref 101–111)
CO2: 27 mmol/L (ref 22–32)
Calcium: 9.2 mg/dL (ref 8.9–10.3)
Creatinine, Ser: 1.17 mg/dL (ref 0.61–1.24)
GFR calc non Af Amer: >60 mL/min (ref >60)
GLUCOSE: 108 mg/dL — AB (ref 65–99)
Potassium: 4.1 mmol/L (ref 3.5–5.1)
Sodium: 136 mmol/L (ref 135–145)

## 2017-04-04 MED ORDER — GADOBENATE DIMEGLUMINE 529 MG/ML IV SOLN
20.0000 mL | Freq: Once | INTRAVENOUS | Status: AC
  Administered 2017-04-04: 18 mL via INTRAVENOUS

## 2017-04-04 MED ORDER — LORAZEPAM 2 MG/ML IJ SOLN
INTRAMUSCULAR | Status: AC
  Filled 2017-04-04: qty 1

## 2017-04-04 MED ORDER — LORAZEPAM 2 MG/ML IJ SOLN
1.0000 mg | Freq: Once | INTRAMUSCULAR | Status: AC
  Administered 2017-04-04: 1 mg via INTRAVENOUS

## 2017-04-04 MED ORDER — SODIUM CHLORIDE 0.9 % IV SOLN: INTRAVENOUS | Status: DC
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
## 2017-04-04 NOTE — Progress Notes (Signed)
Discussed with Dr. Nevada Crane regarding this case. Patient had 1 episode of seizure like activity witnessed by police officers. MRI brain and EEG were ordered. MRI brain showed some white matter disease, but no acute abnormality. EEG was normal.   I made general recommendations for a first time seizure,that for 1 seizure with normal MRI and EEG, we will not need to start an AED. Patient however should be told to not drive for 6 months, avoid sleep deprivation and follow up with outpatient neurologist.

## 2017-04-14 ENCOUNTER — Observation Stay (HOSPITAL_COMMUNITY)
Admission: EM | Admit: 2017-04-14 | Discharge: 2017-04-15 | Disposition: A | Discharge status: Home / Self Care | Attending: Internal Medicine | Admitting: Internal Medicine

## 2017-04-14 ENCOUNTER — Other Ambulatory Visit: Payer: Self-pay

## 2017-04-14 ENCOUNTER — Emergency Department (HOSPITAL_COMMUNITY)

## 2017-04-14 DIAGNOSIS — Z87891 Personal history of nicotine dependence: Secondary | ICD-10-CM | POA: Insufficient documentation

## 2017-04-14 DIAGNOSIS — F319 Bipolar disorder, unspecified: Secondary | ICD-10-CM

## 2017-04-14 DIAGNOSIS — I200 Unstable angina: Principal | ICD-10-CM | POA: Diagnosis present

## 2017-04-14 DIAGNOSIS — R079 Chest pain, unspecified: Secondary | ICD-10-CM | POA: Diagnosis present

## 2017-04-14 DIAGNOSIS — Z79899 Other long term (current) drug therapy: Secondary | ICD-10-CM | POA: Insufficient documentation

## 2017-04-14 DIAGNOSIS — I2510 Atherosclerotic heart disease of native coronary artery without angina pectoris: Secondary | ICD-10-CM

## 2017-04-14 DIAGNOSIS — Z859 Personal history of malignant neoplasm, unspecified: Secondary | ICD-10-CM | POA: Diagnosis not present

## 2017-04-14 DIAGNOSIS — D696 Thrombocytopenia, unspecified: Secondary | ICD-10-CM

## 2017-04-14 DIAGNOSIS — R569 Unspecified convulsions: Secondary | ICD-10-CM

## 2017-04-14 DIAGNOSIS — Z7982 Long term (current) use of aspirin: Secondary | ICD-10-CM | POA: Insufficient documentation

## 2017-04-14 DIAGNOSIS — N2889 Other specified disorders of kidney and ureter: Secondary | ICD-10-CM | POA: Diagnosis present

## 2017-04-14 DIAGNOSIS — I10 Essential (primary) hypertension: Secondary | ICD-10-CM | POA: Diagnosis not present

## 2017-04-14 HISTORY — DX: Bipolar disorder, unspecified: F31.9

## 2017-04-14 HISTORY — DX: Schizophrenia, unspecified: F20.9

## 2017-04-14 HISTORY — DX: Thrombocytopenia, unspecified: D69.6

## 2017-04-14 LAB — CBC
HCT: 39.6 % (ref 39.0–52.0)
HEMOGLOBIN: 13.9 g/dL (ref 13.0–17.0)
MCH: 31.3 pg (ref 26.0–34.0)
MCHC: 35.1 g/dL (ref 30.0–36.0)
MCV: 89.2 fL (ref 78.0–100.0)
Platelets: 146 10*3/uL — ABNORMAL LOW (ref 150–400)
RBC: 4.44 MIL/uL (ref 4.22–5.81)
RDW: 12.4 % (ref 11.5–15.5)
WBC: 4.1 10*3/uL (ref 4.0–10.5)

## 2017-04-14 LAB — BASIC METABOLIC PANEL
ANION GAP: 10 (ref 5–15)
BUN: 16 mg/dL (ref 6–20)
CALCIUM: 9.6 mg/dL (ref 8.9–10.3)
CO2: 24 mmol/L (ref 22–32)
Chloride: 101 mmol/L (ref 101–111)
Creatinine, Ser: 1.2 mg/dL (ref 0.61–1.24)
GFR calc Af Amer: >60 mL/min (ref >60)
GLUCOSE: 106 mg/dL — AB (ref 65–99)
POTASSIUM: 4.2 mmol/L (ref 3.5–5.1)
SODIUM: 135 mmol/L (ref 135–145)

## 2017-04-14 LAB — I-STAT TROPONIN, ED: TROPONIN I, POC: 0 ng/mL (ref 0.00–0.08)

## 2017-04-14 MED ORDER — MORPHINE SULFATE (PF) 4 MG/ML IV SOLN
4.0000 mg | Freq: Once | INTRAVENOUS | Status: AC
  Administered 2017-04-14: 4 mg via INTRAVENOUS
  Filled 2017-04-14: qty 1

## 2017-04-14 MED ORDER — ONDANSETRON HCL 4 MG/2ML IJ SOLN
4.0000 mg | Freq: Once | INTRAMUSCULAR | Status: AC
  Administered 2017-04-14: 4 mg via INTRAVENOUS
  Filled 2017-04-14: qty 2
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
## 2017-04-14 NOTE — ED Triage Notes (Signed)
Pt to ed from jail for cp. Feels pressure, radiation to back and left arm. Diaphoresis noted with EMS, endorses nausea and chronic SOB, feeling ill throughout day. EMS reports slight shaking of pt on arrival but maintained consciousness and A/O x4. ASA, 4 nitro in route with no relief. 132/82, HR 90, 98% RA. NAD.

## 2017-04-14 NOTE — ED Notes (Addendum)
Pt arrives to ED via EMS in custody of [facility] deputies. Ankle chains present.

## 2017-04-15 ENCOUNTER — Encounter (HOSPITAL_COMMUNITY): Payer: Self-pay

## 2017-04-15 DIAGNOSIS — I25119 Atherosclerotic heart disease of native coronary artery with unspecified angina pectoris: Secondary | ICD-10-CM | POA: Diagnosis not present

## 2017-04-15 DIAGNOSIS — D696 Thrombocytopenia, unspecified: Secondary | ICD-10-CM | POA: Diagnosis not present

## 2017-04-15 DIAGNOSIS — F319 Bipolar disorder, unspecified: Secondary | ICD-10-CM

## 2017-04-15 DIAGNOSIS — R079 Chest pain, unspecified: Secondary | ICD-10-CM | POA: Diagnosis not present

## 2017-04-15 DIAGNOSIS — N2889 Other specified disorders of kidney and ureter: Secondary | ICD-10-CM

## 2017-04-15 DIAGNOSIS — M7918 Myalgia, other site: Secondary | ICD-10-CM

## 2017-04-15 DIAGNOSIS — F3170 Bipolar disorder, currently in remission, most recent episode unspecified: Secondary | ICD-10-CM | POA: Diagnosis not present

## 2017-04-15 DIAGNOSIS — I200 Unstable angina: Secondary | ICD-10-CM | POA: Diagnosis not present

## 2017-04-15 DIAGNOSIS — I10 Essential (primary) hypertension: Secondary | ICD-10-CM

## 2017-04-15 DIAGNOSIS — M940 Chondrocostal junction syndrome [Tietze]: Secondary | ICD-10-CM

## 2017-04-15 LAB — TROPONIN I
TROPONIN I: 0.05 ng/mL — AB (ref <0.03)
TROPONIN I: 0.06 ng/mL — AB (ref <0.03)
Troponin I: 0.05 ng/mL (ref <0.03)
Troponin I: 0.06 ng/mL (ref <0.03)

## 2017-04-15 LAB — CBC
HEMATOCRIT: 41.1 % (ref 39.0–52.0)
HEMOGLOBIN: 14.2 g/dL (ref 13.0–17.0)
MCH: 30.9 pg (ref 26.0–34.0)
MCHC: 34.5 g/dL (ref 30.0–36.0)
MCV: 89.5 fL (ref 78.0–100.0)
Platelets: 146 10*3/uL — ABNORMAL LOW (ref 150–400)
RBC: 4.59 MIL/uL (ref 4.22–5.81)
RDW: 12.4 % (ref 11.5–15.5)
WBC: 5.6 10*3/uL (ref 4.0–10.5)

## 2017-04-15 LAB — RAPID URINE DRUG SCREEN, HOSP PERFORMED
Amphetamines: NOT DETECTED
BARBITURATES: NOT DETECTED
BENZODIAZEPINES: NOT DETECTED
COCAINE: NOT DETECTED
OPIATES: POSITIVE — AB
TETRAHYDROCANNABINOL: NOT DETECTED

## 2017-04-15 LAB — HIV ANTIBODY (ROUTINE TESTING W REFLEX): HIV Screen 4th Generation wRfx: NONREACTIVE

## 2017-04-15 LAB — CREATININE, SERUM
CREATININE: 1.21 mg/dL (ref 0.61–1.24)
GFR calc Af Amer: >60 mL/min (ref >60)

## 2017-04-15 MED ORDER — LISINOPRIL 10 MG PO TABS
10.0000 mg | ORAL_TABLET | Freq: Every day | ORAL | Status: DC
  Administered 2017-04-15: 10 mg via ORAL
  Filled 2017-04-15: qty 1

## 2017-04-15 MED ORDER — ACETAMINOPHEN 325 MG PO TABS: 650.0000 mg | ORAL_TABLET | ORAL | Status: DC | PRN

## 2017-04-15 MED ORDER — METOPROLOL TARTRATE 25 MG PO TABS
25.0000 mg | ORAL_TABLET | Freq: Two times a day (BID) | ORAL | Status: DC
  Administered 2017-04-15: 25 mg via ORAL
  Filled 2017-04-15: qty 1

## 2017-04-15 MED ORDER — HYDROCHLOROTHIAZIDE 25 MG PO TABS
25.0000 mg | ORAL_TABLET | Freq: Every day | ORAL | Status: DC
  Administered 2017-04-15: 25 mg via ORAL
  Filled 2017-04-15: qty 1

## 2017-04-15 MED ORDER — ASPIRIN 81 MG PO CHEW
324.0000 mg | CHEWABLE_TABLET | Freq: Once | ORAL | Status: DC
Start: 2017-04-15 — End: 2017-04-15

## 2017-04-15 MED ORDER — ASPIRIN EC 81 MG PO TBEC
81.0000 mg | DELAYED_RELEASE_TABLET | Freq: Every day | ORAL | Status: DC
  Administered 2017-04-15: 81 mg via ORAL
  Filled 2017-04-15: qty 1

## 2017-04-15 MED ORDER — ACETAMINOPHEN 325 MG PO TABS: 650.0000 mg | ORAL_TABLET | Freq: Four times a day (QID) | ORAL | 0 refills | Status: DC | PRN

## 2017-04-15 MED ORDER — INFLUENZA VAC SPLIT QUAD 0.5 ML IM SUSY: 0.5000 mL | PREFILLED_SYRINGE | INTRAMUSCULAR | Status: DC

## 2017-04-15 MED ORDER — PNEUMOCOCCAL VAC POLYVALENT 25 MCG/0.5ML IJ INJ: 0.5000 mL | INJECTION | INTRAMUSCULAR | Status: DC

## 2017-04-15 MED ORDER — ENOXAPARIN SODIUM 40 MG/0.4ML ~~LOC~~ SOLN
40.0000 mg | SUBCUTANEOUS | Status: DC
  Administered 2017-04-15: 40 mg via SUBCUTANEOUS
  Filled 2017-04-15: qty 0.4

## 2017-04-15 MED ORDER — PANTOPRAZOLE SODIUM 40 MG PO TBEC
40.0000 mg | DELAYED_RELEASE_TABLET | Freq: Every day | ORAL | Status: DC
  Administered 2017-04-15: 40 mg via ORAL
  Filled 2017-04-15: qty 1

## 2017-04-15 MED ORDER — ONDANSETRON HCL 4 MG/2ML IJ SOLN: 4.0000 mg | Freq: Four times a day (QID) | INTRAMUSCULAR | Status: DC | PRN

## 2017-04-15 MED ORDER — LISINOPRIL 10 MG PO TABS: 10.0000 mg | ORAL_TABLET | Freq: Every day | ORAL | 0 refills | Status: DC

## 2017-04-15 MED ORDER — ATORVASTATIN CALCIUM 40 MG PO TABS: 40.0000 mg | ORAL_TABLET | Freq: Every day | ORAL | 0 refills | Status: DC

## 2017-04-15 MED ORDER — NITROGLYCERIN 0.4 MG SL SUBL: 0.4000 mg | SUBLINGUAL_TABLET | SUBLINGUAL | Status: DC | PRN

## 2017-04-15 MED ORDER — DOCUSATE SODIUM 100 MG PO CAPS
200.0000 mg | ORAL_CAPSULE | Freq: Every day | ORAL | Status: DC
  Filled 2017-04-15: qty 2

## 2017-04-15 MED ORDER — ATORVASTATIN CALCIUM 40 MG PO TABS: 40.0000 mg | ORAL_TABLET | Freq: Every day | ORAL | Status: DC

## 2017-04-15 MED ORDER — PANTOPRAZOLE SODIUM 40 MG PO TBEC: 40.0000 mg | DELAYED_RELEASE_TABLET | Freq: Every day | ORAL | 0 refills | Status: DC
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
## 2017-04-15 NOTE — Progress Notes (Signed)
Progress Note  Patient Name: Stephen [name] Date of Encounter: 04/15/2017  Primary Cardiologist: Marlou Porch   Subjective   52 y.o. male with a hx of CAD with CTO to RCA, HTN,  DM, prior stroke, multiple cardiac comorbidities who is being seen today for the evaluation of chest pain at the request of Domenic Moras.  The patient was hospitalized in December, 2018 and had a left heart catheterization.  He was found to have a chronic total occlusion of the right coronary artery with excellent collateral flow.  He had a very minor troponin elevation at that time that was thought to be due to demand ischemia.  In February, 2019 he had an episode of chest pain in the setting of possible seizure-like activity.  He had a mild troponin elevation at that time of 0.23.  He presents today with several days of chest pain.  He has chest wall tenderness.  Troponins are negative. EKG shows small Q waves inferiorly.  He has early repolarization.  There are no acute changes. Inpatient Medications    Scheduled Meds: . aspirin  324 mg Oral Once  . aspirin EC  81 mg Oral Daily  . atorvastatin  40 mg Oral q1800  . docusate sodium  200 mg Oral Daily  . enoxaparin (LOVENOX) injection  40 mg Subcutaneous Q24H  . hydrochlorothiazide  25 mg Oral Daily  . [START ON 04/16/2017] Influenza vac split quadrivalent PF  0.5 mL Intramuscular Tomorrow-1000  . lisinopril  10 mg Oral Daily  . metoprolol tartrate  25 mg Oral BID  . pantoprazole  40 mg Oral QAC breakfast  . [START ON 04/16/2017] pneumococcal 23 valent vaccine  0.5 mL Intramuscular Tomorrow-1000   Continuous Infusions:  PRN Meds: acetaminophen, nitroGLYCERIN, ondansetron (ZOFRAN) IV   Vital Signs    Vitals:   04/15/17 0102 04/15/17 0544 04/15/17 0910 04/15/17 0913  BP: (!) 148/88 136/86 137/78   Pulse: 67 60  65  Resp: 17 16    Temp: 98.2 F (36.8 C) 98.6 F (37 C)    TempSrc: Oral Oral    SpO2: 99% 99%    Weight: 176 lb 1.6 oz (79.9 kg) 176 lb  (79.8 kg)    Height:        Intake/Output Summary (Last 24 hours) at 04/15/2017 0923 Last data filed at 04/15/2017 0546 Gross per 24 hour  Intake 360 ml  Output 500 ml  Net -140 ml   Filed Weights   04/14/17 2148 04/15/17 0102 04/15/17 0544  Weight: 185 lb (83.9 kg) 176 lb 1.6 oz (79.9 kg) 176 lb (79.8 kg)    Telemetry   NSR  - Personally Reviewed  ECG     NSR , inf. Q waves, early repol - Personally Reviewed  Physical Exam   GEN: middle age, black male, NAD    Neck: No JVD Cardiac: RRR, no murmurs, rubs, or gallops.  + chest wall tenderness.  Respiratory: Clear to auscultation bilaterally. GI: Soft, nontender, non-distended  MS: No edema; No deformity.  Leg cuffs on  Neuro:  Nonfocal  Psych: Normal affect   Labs    Chemistry Recent Labs  Lab 04/14/17 2152 04/15/17 0124  NA 135  --   K 4.2  --   CL 101  --   CO2 24  --   GLUCOSE 106*  --   BUN 16  --   CREATININE 1.20 1.21  CALCIUM 9.6  --   GFRNONAA >60 >60  GFRAA >60 >60  ANIONGAP 10  --      Hematology Recent Labs  Lab 04/14/17 2152 04/15/17 0124  WBC 4.1 5.6  RBC 4.44 4.59  HGB 13.9 14.2  HCT 39.6 41.1  MCV 89.2 89.5  MCH 31.3 30.9  MCHC 35.1 34.5  RDW 12.4 12.4  PLT 146* 146*    Cardiac Enzymes Recent Labs  Lab 04/15/17 0124 04/15/17 0400 04/15/17 0701  TROPONINI 0.05* 0.05* 0.06*    Recent Labs  Lab 04/14/17 2203  TROPIPOC 0.00     BNPNo results for input(s): BNP, PROBNP in the last 168 hours.   DDimer No results for input(s): DDIMER in the last 168 hours.   Radiology    Dg Chest 2 View  Result Date: 04/14/2017 CLINICAL DATA:  Initial evaluation for acute chest pain. EXAM: CHEST - 2 VIEW COMPARISON:  Prior radiograph from 04/01/2017. FINDINGS: The cardiac and mediastinal silhouettes are stable in size and contour, and remain within normal limits. The lungs are normally inflated. No airspace consolidation, pleural effusion, or pulmonary edema is identified. There is no  pneumothorax. No acute osseous abnormality identified. IMPRESSION: No active cardiopulmonary disease. Electronically Signed   By: Jeannine Boga M.D.   On: 04/14/2017 22:39    Cardiac Studies      Patient Profile     52 y.o. male with history of coronary artery disease-chronic total occlusion of the right coronary artery.  He has good left right collaterals.  Assessment & Plan    1.  Chest pain: The episodes of chest pain or very atypical.  Seem to be more consistent with a musculoskeletal etiology.  It tends to hurt worse when he twists or turns his torso or with palpation.  Troponins are negative.  EKG shows no changes.  We will allow him to eat. He could be discharged either later today or perhaps tomorrow. He had an echocardiogram several weeks ago.  This does not need to be repeated.  For questions or updates, please contact [place] Please consult [email or web address] for contact info under Cardiology/STEMI.      Signed, Mertie Moores, MD  04/15/2017, 9:23 AM
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
## 2017-04-15 NOTE — ED Notes (Addendum)
Attempted report to 6E, left name and number with nurse who sts will call right back.
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
## 2017-04-15 NOTE — Discharge Instructions (Signed)

## 2017-04-17 LAB — HCV AB W REFLEX TO QUANT PCR: HCV Ab: <0.1 s/co ratio (ref 0.0–0.9)

## 2017-04-17 LAB — HCV INTERPRETATION

## 2017-04-18 ENCOUNTER — Emergency Department (HOSPITAL_COMMUNITY)

## 2017-04-18 ENCOUNTER — Encounter (HOSPITAL_COMMUNITY): Payer: Self-pay

## 2017-04-18 ENCOUNTER — Emergency Department (HOSPITAL_COMMUNITY)
Admission: EM | Admit: 2017-04-18 | Discharge: 2017-04-19 | Attending: Emergency Medicine | Admitting: Emergency Medicine

## 2017-04-18 ENCOUNTER — Other Ambulatory Visit: Payer: Self-pay

## 2017-04-18 DIAGNOSIS — F319 Bipolar disorder, unspecified: Secondary | ICD-10-CM | POA: Diagnosis not present

## 2017-04-18 DIAGNOSIS — Z7982 Long term (current) use of aspirin: Secondary | ICD-10-CM | POA: Diagnosis not present

## 2017-04-18 DIAGNOSIS — E119 Type 2 diabetes mellitus without complications: Secondary | ICD-10-CM | POA: Insufficient documentation

## 2017-04-18 DIAGNOSIS — F209 Schizophrenia, unspecified: Secondary | ICD-10-CM | POA: Diagnosis not present

## 2017-04-18 DIAGNOSIS — Z87891 Personal history of nicotine dependence: Secondary | ICD-10-CM | POA: Diagnosis not present

## 2017-04-18 DIAGNOSIS — Z79899 Other long term (current) drug therapy: Secondary | ICD-10-CM | POA: Insufficient documentation

## 2017-04-18 DIAGNOSIS — R079 Chest pain, unspecified: Secondary | ICD-10-CM | POA: Diagnosis not present

## 2017-04-18 DIAGNOSIS — Z85528 Personal history of other malignant neoplasm of kidney: Secondary | ICD-10-CM | POA: Insufficient documentation

## 2017-04-18 DIAGNOSIS — R55 Syncope and collapse: Secondary | ICD-10-CM

## 2017-04-18 DIAGNOSIS — I10 Essential (primary) hypertension: Secondary | ICD-10-CM | POA: Diagnosis not present

## 2017-04-18 DIAGNOSIS — I2510 Atherosclerotic heart disease of native coronary artery without angina pectoris: Secondary | ICD-10-CM | POA: Insufficient documentation

## 2017-04-18 LAB — CBC
HCT: 40.2 % (ref 39.0–52.0)
Hemoglobin: 14 g/dL (ref 13.0–17.0)
MCH: 31.5 pg (ref 26.0–34.0)
MCHC: 34.8 g/dL (ref 30.0–36.0)
MCV: 90.3 fL (ref 78.0–100.0)
Platelets: 141 K/uL — ABNORMAL LOW (ref 150–400)
RBC: 4.45 MIL/uL (ref 4.22–5.81)
RDW: 12.7 % (ref 11.5–15.5)
WBC: 4.4 K/uL (ref 4.0–10.5)

## 2017-04-18 LAB — I-STAT TROPONIN, ED: Troponin i, poc: 0 ng/mL (ref 0.00–0.08)

## 2017-04-18 LAB — CBG MONITORING, ED: GLUCOSE-CAPILLARY: 99 mg/dL (ref 65–99)

## 2017-04-18 NOTE — ED Triage Notes (Signed)
Patient BIB by [facility] from [facility]. Patient had syncopal episode 30 min prior to arrival. Patient reports hitting his head on the floor when he fell.  Patient also c/o 10/10 chest pain that radiates to LEFT shoulder and LEFT arm. Given 1 NTG and 324mg  aspirin in route. Patient denies N/V.

## 2017-04-19 ENCOUNTER — Encounter (HOSPITAL_COMMUNITY): Payer: Self-pay | Admitting: Radiology

## 2017-04-19 ENCOUNTER — Emergency Department (HOSPITAL_COMMUNITY)

## 2017-04-19 LAB — I-STAT TROPONIN, ED: Troponin i, poc: 0 ng/mL (ref 0.00–0.08)

## 2017-04-19 LAB — URINALYSIS, ROUTINE W REFLEX MICROSCOPIC
Bilirubin Urine: NEGATIVE
Glucose, UA: NEGATIVE mg/dL
Hgb urine dipstick: NEGATIVE
Ketones, ur: NEGATIVE mg/dL
Leukocytes, UA: NEGATIVE
NITRITE: NEGATIVE
Protein, ur: NEGATIVE mg/dL
SPECIFIC GRAVITY, URINE: 1.011 (ref 1.005–1.030)
pH: 5 (ref 5.0–8.0)

## 2017-04-19 LAB — BASIC METABOLIC PANEL
Anion gap: 10 (ref 5–15)
BUN: 15 mg/dL (ref 6–20)
CHLORIDE: 97 mmol/L — AB (ref 101–111)
CO2: 26 mmol/L (ref 22–32)
Calcium: 9.1 mg/dL (ref 8.9–10.3)
Creatinine, Ser: 1.28 mg/dL — ABNORMAL HIGH (ref 0.61–1.24)
GFR calc non Af Amer: >60 mL/min (ref >60)
Glucose, Bld: 100 mg/dL — ABNORMAL HIGH (ref 65–99)
POTASSIUM: 4.1 mmol/L (ref 3.5–5.1)
SODIUM: 133 mmol/L — AB (ref 135–145)

## 2017-04-19 MED ORDER — SODIUM CHLORIDE 0.9 % IV BOLUS (SEPSIS)
1000.0000 mL | Freq: Once | INTRAVENOUS | Status: AC
  Administered 2017-04-19: 1000 mL via INTRAVENOUS

## 2017-04-19 NOTE — ED Notes (Signed)
Patient returned from CT and xray.

## 2017-04-19 NOTE — ED Provider Notes (Signed)
Patient seen/examined in the Emergency Department in conjunction with Midlevel Provider [facility] Patient reports syncope and chest pain Exam : Alert, no distress, watching television.  Chest wall is tender to palpation Plan: Patient with significant history of CAD.  Will recheck troponin and reassess   Ripley Fraise, MD 04/19/17 [phone]
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
## 2017-04-19 NOTE — Discharge Instructions (Signed)
Your chest pain workup today in the ER does not show any evidence of heart attack.  It is advised by the cardiologist that you follow-up with a cardiologist in the next week and get a holter monitor.

## 2017-04-30 ENCOUNTER — Emergency Department (HOSPITAL_COMMUNITY)

## 2017-04-30 ENCOUNTER — Encounter (HOSPITAL_COMMUNITY): Payer: Self-pay

## 2017-04-30 ENCOUNTER — Inpatient Hospital Stay (HOSPITAL_COMMUNITY)
Admission: EM | Admit: 2017-04-30 | Discharge: 2017-05-01 | DRG: 041 | Disposition: A | Discharge status: Home / Self Care | Attending: Internal Medicine | Admitting: Internal Medicine

## 2017-04-30 DIAGNOSIS — Z89022 Acquired absence of left finger(s): Secondary | ICD-10-CM

## 2017-04-30 DIAGNOSIS — F319 Bipolar disorder, unspecified: Secondary | ICD-10-CM | POA: Diagnosis present

## 2017-04-30 DIAGNOSIS — R079 Chest pain, unspecified: Secondary | ICD-10-CM | POA: Diagnosis present

## 2017-04-30 DIAGNOSIS — I25119 Atherosclerotic heart disease of native coronary artery with unspecified angina pectoris: Secondary | ICD-10-CM

## 2017-04-30 DIAGNOSIS — Z8673 Personal history of transient ischemic attack (TIA), and cerebral infarction without residual deficits: Secondary | ICD-10-CM

## 2017-04-30 DIAGNOSIS — D696 Thrombocytopenia, unspecified: Secondary | ICD-10-CM | POA: Diagnosis present

## 2017-04-30 DIAGNOSIS — Z7982 Long term (current) use of aspirin: Secondary | ICD-10-CM | POA: Diagnosis not present

## 2017-04-30 DIAGNOSIS — E785 Hyperlipidemia, unspecified: Secondary | ICD-10-CM | POA: Diagnosis present

## 2017-04-30 DIAGNOSIS — R569 Unspecified convulsions: Principal | ICD-10-CM

## 2017-04-30 DIAGNOSIS — N2889 Other specified disorders of kidney and ureter: Secondary | ICD-10-CM | POA: Diagnosis present

## 2017-04-30 DIAGNOSIS — Z79899 Other long term (current) drug therapy: Secondary | ICD-10-CM

## 2017-04-30 DIAGNOSIS — Z8249 Family history of ischemic heart disease and other diseases of the circulatory system: Secondary | ICD-10-CM

## 2017-04-30 DIAGNOSIS — F419 Anxiety disorder, unspecified: Secondary | ICD-10-CM | POA: Diagnosis present

## 2017-04-30 DIAGNOSIS — K219 Gastro-esophageal reflux disease without esophagitis: Secondary | ICD-10-CM | POA: Diagnosis present

## 2017-04-30 DIAGNOSIS — N179 Acute kidney failure, unspecified: Secondary | ICD-10-CM

## 2017-04-30 DIAGNOSIS — F259 Schizoaffective disorder, unspecified: Secondary | ICD-10-CM | POA: Diagnosis present

## 2017-04-30 DIAGNOSIS — I25110 Atherosclerotic heart disease of native coronary artery with unstable angina pectoris: Secondary | ICD-10-CM | POA: Diagnosis present

## 2017-04-30 DIAGNOSIS — Z87891 Personal history of nicotine dependence: Secondary | ICD-10-CM

## 2017-04-30 DIAGNOSIS — R55 Syncope and collapse: Secondary | ICD-10-CM | POA: Diagnosis present

## 2017-04-30 DIAGNOSIS — I10 Essential (primary) hypertension: Secondary | ICD-10-CM | POA: Diagnosis present

## 2017-04-30 DIAGNOSIS — Z880 Allergy status to penicillin: Secondary | ICD-10-CM

## 2017-04-30 DIAGNOSIS — I2510 Atherosclerotic heart disease of native coronary artery without angina pectoris: Secondary | ICD-10-CM | POA: Diagnosis present

## 2017-04-30 DIAGNOSIS — Z85528 Personal history of other malignant neoplasm of kidney: Secondary | ICD-10-CM

## 2017-04-30 DIAGNOSIS — E119 Type 2 diabetes mellitus without complications: Secondary | ICD-10-CM | POA: Diagnosis present

## 2017-04-30 HISTORY — DX: Unspecified convulsions: R56.9

## 2017-04-30 HISTORY — DX: Acute kidney failure, unspecified: N17.9

## 2017-04-30 LAB — CBC WITH DIFFERENTIAL/PLATELET
BASOS PCT: 0 %
Basophils Absolute: 0 10*3/uL (ref 0.0–0.1)
EOS ABS: 0.1 10*3/uL (ref 0.0–0.7)
EOS PCT: 1 %
HCT: 37.2 % — ABNORMAL LOW (ref 39.0–52.0)
Hemoglobin: 12.7 g/dL — ABNORMAL LOW (ref 13.0–17.0)
LYMPHS ABS: 1.3 10*3/uL (ref 0.7–4.0)
Lymphocytes Relative: 28 %
MCH: 30.9 pg (ref 26.0–34.0)
MCHC: 34.1 g/dL (ref 30.0–36.0)
MCV: 90.5 fL (ref 78.0–100.0)
Monocytes Absolute: 0.4 10*3/uL (ref 0.1–1.0)
Monocytes Relative: 8 %
Neutro Abs: 2.9 10*3/uL (ref 1.7–7.7)
Neutrophils Relative %: 63 %
PLATELETS: 126 10*3/uL — AB (ref 150–400)
RBC: 4.11 MIL/uL — AB (ref 4.22–5.81)
RDW: 12.6 % (ref 11.5–15.5)
WBC: 4.6 10*3/uL (ref 4.0–10.5)

## 2017-04-30 LAB — COMPREHENSIVE METABOLIC PANEL
ALT: 39 U/L (ref 17–63)
ANION GAP: 9 (ref 5–15)
AST: 37 U/L (ref 15–41)
Albumin: 3.4 g/dL — ABNORMAL LOW (ref 3.5–5.0)
Alkaline Phosphatase: 54 U/L (ref 38–126)
BUN: 15 mg/dL (ref 6–20)
CHLORIDE: 101 mmol/L (ref 101–111)
CO2: 25 mmol/L (ref 22–32)
Calcium: 8.5 mg/dL — ABNORMAL LOW (ref 8.9–10.3)
Creatinine, Ser: 1.34 mg/dL — ABNORMAL HIGH (ref 0.61–1.24)
GFR calc non Af Amer: 59 mL/min — ABNORMAL LOW (ref >60)
Glucose, Bld: 100 mg/dL — ABNORMAL HIGH (ref 65–99)
Potassium: 4.5 mmol/L (ref 3.5–5.1)
SODIUM: 135 mmol/L (ref 135–145)
Total Bilirubin: 0.4 mg/dL (ref 0.3–1.2)
Total Protein: 6.7 g/dL (ref 6.5–8.1)

## 2017-04-30 LAB — D-DIMER, QUANTITATIVE: D-Dimer, Quant: 0.38 ug/mL-FEU (ref 0.00–0.50)

## 2017-04-30 LAB — RAPID URINE DRUG SCREEN, HOSP PERFORMED
AMPHETAMINES: NOT DETECTED
BARBITURATES: NOT DETECTED
BENZODIAZEPINES: NOT DETECTED
COCAINE: NOT DETECTED
Opiates: POSITIVE — AB
TETRAHYDROCANNABINOL: NOT DETECTED

## 2017-04-30 LAB — TROPONIN I: Troponin I: <0.03 ng/mL (ref <0.03)

## 2017-04-30 LAB — I-STAT TROPONIN, ED: Troponin i, poc: 0 ng/mL (ref 0.00–0.08)

## 2017-04-30 MED ORDER — LEVETIRACETAM 500 MG PO TABS
500.0000 mg | ORAL_TABLET | Freq: Two times a day (BID) | ORAL | Status: DC
  Administered 2017-04-30 – 2017-05-01 (×2): 500 mg via ORAL
  Filled 2017-04-30 (×2): qty 1

## 2017-04-30 MED ORDER — ASPIRIN EC 81 MG PO TBEC
81.0000 mg | DELAYED_RELEASE_TABLET | Freq: Every day | ORAL | Status: DC
  Administered 2017-05-01: 81 mg via ORAL
  Filled 2017-04-30: qty 1

## 2017-04-30 MED ORDER — ONDANSETRON HCL 4 MG/2ML IJ SOLN: 4.0000 mg | Freq: Four times a day (QID) | INTRAMUSCULAR | Status: DC | PRN

## 2017-04-30 MED ORDER — NITROGLYCERIN 0.4 MG SL SUBL: 0.4000 mg | SUBLINGUAL_TABLET | SUBLINGUAL | Status: DC | PRN

## 2017-04-30 MED ORDER — ACETAMINOPHEN 325 MG PO TABS: 650.0000 mg | ORAL_TABLET | Freq: Four times a day (QID) | ORAL | Status: DC | PRN

## 2017-04-30 MED ORDER — ATORVASTATIN CALCIUM 40 MG PO TABS: 40.0000 mg | ORAL_TABLET | Freq: Every day | ORAL | Status: DC

## 2017-04-30 MED ORDER — ISOSORBIDE MONONITRATE ER 60 MG PO TB24
60.0000 mg | ORAL_TABLET | Freq: Every day | ORAL | Status: DC
  Administered 2017-05-01: 60 mg via ORAL
  Filled 2017-04-30: qty 2

## 2017-04-30 MED ORDER — LISINOPRIL 10 MG PO TABS
10.0000 mg | ORAL_TABLET | Freq: Every day | ORAL | Status: DC
  Administered 2017-05-01: 10 mg via ORAL
  Filled 2017-04-30: qty 1

## 2017-04-30 MED ORDER — ONDANSETRON HCL 4 MG PO TABS: 4.0000 mg | ORAL_TABLET | Freq: Four times a day (QID) | ORAL | Status: DC | PRN

## 2017-04-30 MED ORDER — ENOXAPARIN SODIUM 40 MG/0.4ML ~~LOC~~ SOLN
40.0000 mg | SUBCUTANEOUS | Status: DC
  Administered 2017-04-30: 40 mg via SUBCUTANEOUS
  Filled 2017-04-30: qty 0.4

## 2017-04-30 MED ORDER — PANTOPRAZOLE SODIUM 40 MG PO TBEC
40.0000 mg | DELAYED_RELEASE_TABLET | Freq: Every day | ORAL | Status: DC
  Administered 2017-05-01: 40 mg via ORAL
  Filled 2017-04-30: qty 1

## 2017-04-30 MED ORDER — ISOSORBIDE MONONITRATE ER 30 MG PO TB24: 30.0000 mg | ORAL_TABLET | Freq: Every day | ORAL | Status: DC

## 2017-04-30 MED ORDER — METOPROLOL TARTRATE 12.5 MG HALF TABLET
12.5000 mg | ORAL_TABLET | Freq: Two times a day (BID) | ORAL | Status: DC
  Administered 2017-04-30 – 2017-05-01 (×2): 12.5 mg via ORAL
  Filled 2017-04-30 (×2): qty 1

## 2017-04-30 MED ORDER — HYDRALAZINE HCL 20 MG/ML IJ SOLN: 10.0000 mg | Freq: Three times a day (TID) | INTRAMUSCULAR | Status: DC | PRN

## 2017-04-30 MED ORDER — SODIUM CHLORIDE 0.9 % IV SOLN: INTRAVENOUS | Status: DC

## 2017-04-30 MED ORDER — SENNOSIDES-DOCUSATE SODIUM 8.6-50 MG PO TABS: 1.0000 | ORAL_TABLET | Freq: Every evening | ORAL | Status: DC | PRN

## 2017-04-30 MED ORDER — HYDROCHLOROTHIAZIDE 12.5 MG PO CAPS
12.5000 mg | ORAL_CAPSULE | Freq: Every day | ORAL | Status: DC
  Administered 2017-05-01: 12.5 mg via ORAL
  Filled 2017-04-30: qty 1

## 2017-04-30 MED ORDER — KETOROLAC TROMETHAMINE 15 MG/ML IJ SOLN: 15.0000 mg | Freq: Four times a day (QID) | INTRAMUSCULAR | Status: DC | PRN

## 2017-04-30 MED ORDER — LORAZEPAM 2 MG/ML IJ SOLN: 1.0000 mg | INTRAMUSCULAR | Status: DC | PRN

## 2017-04-30 MED ORDER — SENNOSIDES-DOCUSATE SODIUM 8.6-50 MG PO TABS
1.0000 | ORAL_TABLET | Freq: Every day | ORAL | Status: DC
  Filled 2017-04-30: qty 1

## 2017-04-30 MED ORDER — TRAZODONE HCL 50 MG PO TABS: 25.0000 mg | ORAL_TABLET | Freq: Every evening | ORAL | Status: DC | PRN

## 2017-04-30 NOTE — ED Notes (Signed)
Mulitple MD at bedsdie
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
## 2017-04-30 NOTE — ED Notes (Signed)
Attempted to call report

## 2017-04-30 NOTE — ED Notes (Addendum)
Patient transported to X-ray. Pt has handcuff at right wrist and bilateral ankle.

## 2017-04-30 NOTE — Progress Notes (Signed)
Called Dietary at [phone]  to get tray for new admit. Spoke with Lurline Idol, she will send a sandwich tray.
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
## 2017-04-30 NOTE — ED Triage Notes (Signed)
[facility] after witnessed seizure like activity by guard who is now at bedside. Pt was on 4" mattress on the floor but fell off mattress c/o neck pain, presents in towel roll.  Hx of 3 MI's.  Pt also c/o new centralized chest pain.  Pt given 3 SL Nitro without and changes or relief.  4mg  Morphine with out and changes in pain or relief.  4mg  Zofran that pt reports helped with Nausea.

## 2017-05-01 ENCOUNTER — Observation Stay (HOSPITAL_COMMUNITY)

## 2017-05-01 ENCOUNTER — Encounter (HOSPITAL_COMMUNITY)
Admission: EM | Disposition: A | Discharge status: Home / Self Care | Payer: Self-pay | Source: Home / Self Care | Attending: Internal Medicine

## 2017-05-01 ENCOUNTER — Other Ambulatory Visit: Payer: Self-pay

## 2017-05-01 DIAGNOSIS — R55 Syncope and collapse: Secondary | ICD-10-CM | POA: Diagnosis not present

## 2017-05-01 DIAGNOSIS — R569 Unspecified convulsions: Secondary | ICD-10-CM | POA: Diagnosis not present

## 2017-05-01 DIAGNOSIS — I10 Essential (primary) hypertension: Secondary | ICD-10-CM | POA: Diagnosis not present

## 2017-05-01 DIAGNOSIS — R079 Chest pain, unspecified: Secondary | ICD-10-CM

## 2017-05-01 HISTORY — PX: LOOP RECORDER INSERTION: EP1214

## 2017-05-01 LAB — BASIC METABOLIC PANEL
Anion gap: 9 (ref 5–15)
BUN: 14 mg/dL (ref 6–20)
CALCIUM: 8.7 mg/dL — AB (ref 8.9–10.3)
CO2: 27 mmol/L (ref 22–32)
Chloride: 100 mmol/L — ABNORMAL LOW (ref 101–111)
Creatinine, Ser: 1.18 mg/dL (ref 0.61–1.24)
GFR calc Af Amer: >60 mL/min (ref >60)
GLUCOSE: 105 mg/dL — AB (ref 65–99)
Potassium: 3.8 mmol/L (ref 3.5–5.1)
Sodium: 136 mmol/L (ref 135–145)

## 2017-05-01 LAB — CBC
HEMATOCRIT: 39.4 % (ref 39.0–52.0)
Hemoglobin: 13.4 g/dL (ref 13.0–17.0)
MCH: 30.9 pg (ref 26.0–34.0)
MCHC: 34 g/dL (ref 30.0–36.0)
MCV: 90.8 fL (ref 78.0–100.0)
Platelets: 125 10*3/uL — ABNORMAL LOW (ref 150–400)
RBC: 4.34 MIL/uL (ref 4.22–5.81)
RDW: 12.7 % (ref 11.5–15.5)
WBC: 5.3 10*3/uL (ref 4.0–10.5)

## 2017-05-01 LAB — TROPONIN I

## 2017-05-01 SURGERY — LOOP RECORDER INSERTION

## 2017-05-01 MED ORDER — LEVETIRACETAM 500 MG PO TABS: 500.0000 mg | ORAL_TABLET | Freq: Two times a day (BID) | ORAL | 0 refills | Status: DC

## 2017-05-01 MED ORDER — LIDOCAINE-EPINEPHRINE 1 %-1:100000 IJ SOLN
INTRAMUSCULAR | Status: AC
  Filled 2017-05-01: qty 1

## 2017-05-01 MED ORDER — LIDOCAINE-EPINEPHRINE 1 %-1:100000 IJ SOLN
INTRAMUSCULAR | Status: DC | PRN
  Administered 2017-05-01: 20 mL

## 2017-05-01 MED ORDER — ISOSORBIDE MONONITRATE ER 60 MG PO TB24: 60.0000 mg | ORAL_TABLET | Freq: Every day | ORAL | 0 refills | Status: DC

## 2017-05-01 SURGICAL SUPPLY — 2 items
LOOP REVEAL LINQSYS (Prosthesis & Implant Heart) ×2 IMPLANT
PACK LOOP INSERTION (CUSTOM PROCEDURE TRAY) ×3 IMPLANT

## 2017-05-01 NOTE — Progress Notes (Signed)
Routine EKG done this AM, some changes in V1-V3 compared to prior but appears to be a lead placement issue indicated by R wave progression. F/u EKG appears similar to prior. Nurse reports no CP. EP is aware of need for ILR consult. Dayna Dunn PA-C

## 2017-05-01 NOTE — Progress Notes (Addendum)
Subjective: She has had no further episodes while in the hospital.  Complains of no headache, blurred vision, dizziness, abnormal tingling or weakness throughout.  Side effects while being on Keppra.  Exam: Vitals:   05/01/17 0405 05/01/17 0827  BP: 110/61 131/66  Pulse: 88 (!) 51  Resp: 18 18  Temp: 98.1 F (36.7 C) 97.7 F (36.5 C)  SpO2: 96% 100%    Physical Exam   HEENT-  Normocephalic, no lesions, without obvious abnormality.  Normal external eye and conjunctiva.   Cardiovascular- S1-S2 audible, pulses palpable throughout   Lungs-no rhonchi or wheezing noted, no excessive working breathing.  Saturations within normal limits Abdomen- All 4 quadrants palpated and nontender Extremities- Warm, dry and intact Musculoskeletal-no joint tenderness, deformity or swelling Skin-warm and dry, no hyperpigmentation, vitiligo, or suspicious lesions    Neuro:  Mental Status: Alert, oriented, thought content appropriate.  Speech fluent without evidence of aphasia.  Able to follow 3 step commands without difficulty. Cranial Nerves: II: Discs flat bilaterally; Visual fields grossly normal,  III,IV, VI: ptosis not present, extra-ocular motions intact bilaterally pupils equal, round, reactive to light and accommodation V,VII: smile symmetric, facial light touch sensation normal bilaterally VIII: hearing normal bilaterally IX,X: uvula rises symmetrically XI: bilateral shoulder shrug XII: midline tongue extension Motor: Right : Upper extremity   5/5    Left:     Upper extremity   5/5  Lower extremity   5/5     Lower extremity   5/5 Tone and bulk:normal tone throughout; no atrophy noted Sensory: Pinprick and light touch intact throughout, bilaterally Deep Tendon Reflexes: 2+ and symmetric throughout in the brachioradialis, biceps, and patella Plantars: Right: downgoing   Left: downgoing Cerebellar: normal finger-to-nose,  Gait: Not tested    Medications:  Scheduled: . aspirin EC  81  mg Oral Daily  . atorvastatin  40 mg Oral q1800  . enoxaparin (LOVENOX) injection  40 mg Subcutaneous Q24H  . hydrochlorothiazide  12.5 mg Oral Daily  . isosorbide mononitrate  60 mg Oral Daily  . levETIRAcetam  500 mg Oral BID  . lisinopril  10 mg Oral Daily  . metoprolol tartrate  12.5 mg Oral BID  . pantoprazole  40 mg Oral QAC breakfast  . senna-docusate  1 tablet Oral QHS   Continuous: . sodium chloride     HUT:[id number], hydrALAZINE, ketorolac, LORazepam, nitroGLYCERIN, ondansetron **OR** ondansetron (ZOFRAN) IV, senna-docusate, traZODone  Pertinent Labs/Diagnostics: None at this time  Dg Chest 2 View  Result Date: 04/30/2017 CLINICAL DATA:  Left-sided chest pain which shortness-of-breath, nausea, vomiting and dizziness 1 day. EXAM: CHEST - 2 VIEW COMPARISON:  04/19/2017 FINDINGS: Lungs are well inflated without focal airspace consolidation or effusion. Cardiomediastinal silhouette is within normal. There is mild degenerate change of the spine. IMPRESSION: No active cardiopulmonary disease. Electronically Signed   By: Marin Olp M.D.   On: 04/30/2017 15:59     This is a normal EEG for the patients stated age.  There were no focal, hemispheric or lateralizing features.  No epileptiform activity was recorded.  A normal EEG does not exclude the diagnosis of a seizure disorder and if seizure remains high on the list of differential diagnosis, an ambulatory EEG may be of value.  Clinical correlation is required.    Etta Quill PA-C [facility] [phone]   NEUROHOSPITALIST ADDENDUM Seen and examined the patient today.   ASSESSMENT AND PLAN  Mr. Stephen [name] is a 52 y.o. male with PMH of CAD, DM, CVA, HTN,  HLD, bipolar disorder, schizophrenia,Hx of prior admission for complaints of chest pain, syncope and possible seizure in 01/2017,  tented to the ED with complaints of chest pain and witnessed seizure-like activity vs syncopal event.    Seizure  vs Pyschogenic non epileptic event vs cardiac syncope  At this point it is unclear if events are true seizure activity but due to the fact that these episodes have been witnessed and have occurred greater than three times, seizure medication have been started with antiepileptic being Keppra 500 mg twice a day . Loop monitor being places,  If cardiac cause determined as cause of patient symptoms, medications could be discontinued   Recommendations: - EEG obtained and normal  -Continue Keppra 500 mg p.o. twice daily -Follow-up with outpatient neurology in 4 to 6 weeks    Sushanth Aroor MD [facility] [phone]  If 7pm to 7am, please call on call as listed on AMION.

## 2017-05-01 NOTE — Progress Notes (Signed)
Repeat ekg, message sent to Cardio/STEMI, MD returned message and stated to keep NPO until EP visits pt this morning to evaluate need for loop recorder.

## 2017-05-01 NOTE — Progress Notes (Signed)
EEG Completed; Results Pending  

## 2017-05-01 NOTE — Discharge Instructions (Signed)
Post implant care instructions °Keep incision clean and dry for 3 days. °You can remove outer dressing tomorrow. °Leave steri-strips (little pieces of tape) on until seen in the office for wound check appointment. °Call the office ([phone]) for redness, drainage, swelling, or fever. ° °
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
## 2017-05-01 NOTE — Interval H&P Note (Signed)
History and Physical Interval Note:  05/01/2017 4:22 PM  Stephen [name]  has presented today for surgery, with the diagnosis of syncope  The various methods of treatment have been discussed with the patient and family. After consideration of risks, benefits and other options for treatment, the patient has consented to  Procedure(s): LOOP RECORDER INSERTION (N/A) as a surgical intervention .  The patient's history has been reviewed, patient examined, no change in status, stable for surgery.  I have reviewed the patient's chart and labs.  Questions were answered to the patient's satisfaction.     Cristopher Peru

## 2017-05-01 NOTE — Discharge Summary (Signed)
Physician Discharge Summary  Stephen [name] STM:[id number] DOB: 1965-04-04 DOA: 04/30/2017  PCP: Patient, No Pcp Per  Admit date: 04/30/2017 Discharge date: 05/02/2017   Recommendations for Outpatient Follow-Up:   1. Monthly downloads of ILR or PRN 2. No driving until 6 months seziure free   Discharge Diagnosis:   Principal Problem:   Seizure [facility]) Active Problems:   Chest pain   Essential hypertension   Hyperlipidemia   GERD (gastroesophageal reflux disease)   CAD (coronary artery disease)   Bipolar disorder ([place])   AKI (acute kidney injury) [facility])   Discharge disposition:  jail  Discharge Condition: Improved.  Diet recommendation: Low sodium, heart healthy.   Wound care: None.   History of Present Illness:   Stephen [name] is a 52 y.o. male with a Past Medical History of coronary artery disease, bipolar, hypertension, angina who presents with chest pain/seizure   Hospital Course by Problem:   Possible seizure vs cardiac event -loop recorder placed -EEG done and normal -keppra started -outpatient neuro follow up in 4-6 weeks    Medical Consultants:    Cards  neuro   Discharge Exam:   Vitals:   05/01/17 1240 05/01/17 1703  BP: (!) 115/58 137/73  Pulse: (!) 111 62  Resp: 18 18  Temp: 97.8 F (36.6 C) 98.1 F (36.7 C)  SpO2: 99% 100%   Vitals:   05/01/17 0405 05/01/17 0827 05/01/17 1240 05/01/17 1703  BP: 110/61 131/66 (!) 115/58 137/73  Pulse: 88 (!) 51 (!) 111 62  Resp: 18 18 18 18   Temp: 98.1 F (36.7 C) 97.7 F (36.5 C) 97.8 F (36.6 C) 98.1 F (36.7 C)  TempSrc: Oral Oral Oral Oral  SpO2: 96% 100% 99% 100%  Weight:      Height:        Gen:  NAD    The results of significant diagnostics from this hospitalization (including imaging, microbiology, ancillary and laboratory) are listed below for reference.     Procedures and Diagnostic Studies:   Dg Chest 2 View  Result Date: 04/30/2017 CLINICAL DATA:  Left-sided  chest pain which shortness-of-breath, nausea, vomiting and dizziness 1 day. EXAM: CHEST - 2 VIEW COMPARISON:  04/19/2017 FINDINGS: Lungs are well inflated without focal airspace consolidation or effusion. Cardiomediastinal silhouette is within normal. There is mild degenerate change of the spine. IMPRESSION: No active cardiopulmonary disease. Electronically Signed   By: Marin Olp M.D.   On: 04/30/2017 15:59     Labs:   Basic Metabolic Panel: Recent Labs  Lab 04/30/17 1350 05/01/17 0420  NA 135 136  K 4.5 3.8  CL 101 100*  CO2 25 27  GLUCOSE 100* 105*  BUN 15 14  CREATININE 1.34* 1.18  CALCIUM 8.5* 8.7*   GFR Estimated Creatinine Clearance: 82.6 mL/min (by C-G formula based on SCr of 1.18 mg/dL). Liver Function Tests: Recent Labs  Lab 04/30/17 1350  AST 37  ALT 39  ALKPHOS 54  BILITOT 0.4  PROT 6.7  ALBUMIN 3.4*   No results for input(s): LIPASE, AMYLASE in the last 168 hours. No results for input(s): AMMONIA in the last 168 hours. Coagulation profile No results for input(s): INR, PROTIME in the last 168 hours.  CBC: Recent Labs  Lab 04/30/17 1350 05/01/17 0420  WBC 4.6 5.3  NEUTROABS 2.9  --   HGB 12.7* 13.4  HCT 37.2* 39.4  MCV 90.5 90.8  PLT 126* 125*   Cardiac Enzymes: Recent Labs  Lab 04/30/17 1720 04/30/17 2139  05/01/17 0420  TROPONINI <0.03 <0.03 <0.03   BNP: Invalid input(s): POCBNP CBG: No results for input(s): GLUCAP in the last 168 hours. D-Dimer Recent Labs    04/30/17 1350  DDIMER 0.38   Hgb A1c No results for input(s): HGBA1C in the last 72 hours. Lipid Profile No results for input(s): CHOL, HDL, LDLCALC, TRIG, CHOLHDL, LDLDIRECT in the last 72 hours. Thyroid function studies No results for input(s): TSH, T4TOTAL, T3FREE, THYROIDAB in the last 72 hours.  Invalid input(s): FREET3 Anemia work up No results for input(s): VITAMINB12, FOLATE, FERRITIN, TIBC, IRON, RETICCTPCT in the last 72 hours. Microbiology No results found  for this or any previous visit (from the past 240 hour(s)).   Discharge Instructions:   Discharge Instructions    Ambulatory referral to Neurology   Complete by:  As directed    An appointment is requested in approximately:6 weeks   Diet - low sodium heart healthy   Complete by:  As directed    Discharge instructions   Complete by:  As directed    Referral to neurology for follow up No driving until 6 months seizure free Wound care for loop recorder   Increase activity slowly   Complete by:  As directed      Allergies as of 05/01/2017      Reactions   Penicillins Anaphylaxis      Medication List    STOP taking these medications   hydrochlorothiazide 12.5 MG capsule Commonly known as:  MICROZIDE   lisinopril 10 MG tablet Commonly known as:  PRINIVIL,ZESTRIL     TAKE these medications   acetaminophen 325 MG tablet Commonly known as:  TYLENOL Take 2 tablets (650 mg total) by mouth every 6 (six) hours as needed for mild pain or headache. As needed for ribcage pain   aspirin EC 81 MG tablet Take 81 mg by mouth daily.   atorvastatin 40 MG tablet Commonly known as:  LIPITOR Take 1 tablet (40 mg total) by mouth daily at 6 PM.   docusate sodium 100 MG capsule Commonly known as:  COLACE Take 200 mg by mouth at bedtime.   isosorbide mononitrate 60 MG 24 hr tablet Commonly known as:  IMDUR Take 1 tablet (60 mg total) by mouth daily. What changed:    medication strength  how much to take   levETIRAcetam 500 MG tablet Commonly known as:  KEPPRA Take 1 tablet (500 mg total) by mouth 2 (two) times daily.   metoprolol tartrate 25 MG tablet Commonly known as:  LOPRESSOR Take 12.5 mg by mouth 2 (two) times daily.   nitroGLYCERIN 0.4 MG SL tablet Commonly known as:  NITROSTAT Place 0.4 mg under the tongue every 5 (five) minutes as needed for chest pain.   pantoprazole 40 MG tablet Commonly known as:  PROTONIX Take 1 tablet (40 mg total) by mouth daily before  breakfast.      Follow-up Information    [facility] Follow up on 05/15/2017.   Specialty:  Cardiology Why:  12:00PM (noon), wound check visit Contact information: [address], [place] [phone]           Time coordinating discharge: 35 min  Signed:  Geradine Girt   [facility] 05/02/2017, 3:54 PM

## 2017-05-01 NOTE — Procedures (Signed)
HPI:  52 y/o with new onset seizure like activity  TECHNICAL SUMMARY:  A multichannel referential and bipolar montage EEG using the standard international 10-20 system was performed on the patient described as awake.  The dominant background activity consists of 8.5-9 hertz activity seen most prominantly over the posterior head region.  During the beginning of the tracing, there was much myogenic artifact from patient movement.  Low voltage fast (beta) activity is distributed symmetrically and maximally over the anterior head regions.  ACTIVATION:  Stepwise photic stimulation at 4-20 flashes per second was performed and did not elicit any abnormal waveforms.  Hyperventilation was not performed.  EPILEPTIFORM ACTIVITY:  There were no spikes, sharp waves or paroxysmal activity.  SLEEP: Physiologic drowsiness is noted, but no stage II sleep.   IMPRESSION:  This is a normal EEG for the patients stated age.  There were no focal, hemispheric or lateralizing features.  No epileptiform activity was recorded.  A normal EEG does not exclude the diagnosis of a seizure disorder and if seizure remains high on the list of differential diagnosis, an ambulatory EEG may be of value.  Clinical correlation is required.

## 2017-05-01 NOTE — Plan of Care (Signed)
No c/o CP, L Rib pain 2-3-4-/10.

## 2017-05-01 NOTE — Progress Notes (Signed)
Message sent to Cardio/STEMI  [phone], am EKG on chart, please review.

## 2017-05-01 NOTE — Progress Notes (Signed)
Pt kept NPO for EEG with possible Loop recorder placement, after reading Notes, no orders placed yet.
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
## 2017-05-02 ENCOUNTER — Encounter (HOSPITAL_COMMUNITY): Payer: Self-pay | Admitting: Internal Medicine

## 2017-05-03 ENCOUNTER — Telehealth: Payer: Self-pay | Admitting: Cardiology

## 2017-05-03 ENCOUNTER — Ambulatory Visit (INDEPENDENT_AMBULATORY_CARE_PROVIDER_SITE_OTHER): Admitting: Physician Assistant

## 2017-05-03 ENCOUNTER — Encounter: Payer: Self-pay | Admitting: Physician Assistant

## 2017-05-03 VITALS — BP 138/80 | HR 60 | Ht 73.5 in | Wt 175.2 lb

## 2017-05-03 DIAGNOSIS — E7800 Pure hypercholesterolemia, unspecified: Secondary | ICD-10-CM | POA: Diagnosis not present

## 2017-05-03 DIAGNOSIS — I2510 Atherosclerotic heart disease of native coronary artery without angina pectoris: Secondary | ICD-10-CM | POA: Diagnosis not present

## 2017-05-03 DIAGNOSIS — K219 Gastro-esophageal reflux disease without esophagitis: Secondary | ICD-10-CM | POA: Diagnosis not present

## 2017-05-03 DIAGNOSIS — I10 Essential (primary) hypertension: Secondary | ICD-10-CM | POA: Diagnosis not present

## 2017-05-03 DIAGNOSIS — N2889 Other specified disorders of kidney and ureter: Secondary | ICD-10-CM | POA: Diagnosis not present

## 2017-05-03 DIAGNOSIS — R55 Syncope and collapse: Secondary | ICD-10-CM

## 2017-05-03 NOTE — Telephone Encounter (Signed)
Attempted to call Danae Chen at [facility] center. Automated message stating that the call couldn't be completed as dialed.

## 2017-05-03 NOTE — Patient Instructions (Addendum)
Medication Instructions:  No changes  Labwork: Today - BMET   Testing/Procedures: None   Follow-Up: DR. Marlou Porch August 02, 2017 @ 9:40 AM.  Any Other Special Instructions Will Be Listed Below (If Applicable). Discuss with the provider at the jail to get follow up arranged with urology for the kidney mass  If you need a refill on your cardiac medications before your next appointment, please call your pharmacy.

## 2017-05-03 NOTE — Telephone Encounter (Signed)
LMOVM for Hulan Fray to return call.

## 2017-05-03 NOTE — Telephone Encounter (Signed)
Spoke w/ Hulan Fray and asked that she have patient send manual transmissions on the 25-26 of every month. She verbalized understanding. Danae Chen also wanted to confirm that no one told pt of appointments. Called CMA that was covering PA to confirm that pt was not told of any appointments. CMA confirmed no appointment was mentation at the end of the appt today. Informed Hulan Fray of this and she verbalized understanding.
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
## 2017-05-04 ENCOUNTER — Telehealth: Payer: Self-pay | Admitting: *Deleted

## 2017-05-04 LAB — BASIC METABOLIC PANEL
BUN/Creatinine Ratio: 10 (ref 9–20)
BUN: 12 mg/dL (ref 6–24)
CALCIUM: 9.6 mg/dL (ref 8.7–10.2)
CO2: 25 mmol/L (ref 20–29)
CREATININE: 1.2 mg/dL (ref 0.76–1.27)
Chloride: 98 mmol/L (ref 96–106)
GFR calc Af Amer: 80 mL/min/{1.73_m2} (ref >59)
GFR, EST NON AFRICAN AMERICAN: 69 mL/min/{1.73_m2} (ref >59)
Glucose: 85 mg/dL (ref 65–99)
POTASSIUM: 4.5 mmol/L (ref 3.5–5.2)
Sodium: 139 mmol/L (ref 134–144)

## 2017-05-04 NOTE — Telephone Encounter (Signed)
-----   Message from Liliane Shi, [place] sent at 05/04/2017  8:21 AM EDT ----- The kidney function (Creatinine) and potassium are normal. Continue current medications and follow up as planned.  Richardson Dopp, PA-C    05/04/2017 8:21 AM

## 2017-05-04 NOTE — Telephone Encounter (Signed)
I s/w Stephen [name] with the [facility] center due to pt is currently incarcerated at this time. Randall Hiss has been notified of lab results. Danae Chen did ask for results to be faxed to her for the pt's chart there as well. I will fax a copy to Randall Hiss at fax # [phone]. Danae Chen did also want to make sure they will do their best in sending remote check on pt's loop recorder. See phone note from [facility] in our device clinic. Danae Chen states due to the walls being concrete transmissions may not always go through.

## 2017-05-15 ENCOUNTER — Ambulatory Visit: Admitting: *Deleted

## 2017-05-15 DIAGNOSIS — R55 Syncope and collapse: Secondary | ICD-10-CM

## 2017-05-15 NOTE — Progress Notes (Signed)
Wound Loop check in clinic. Steri-Strips removed prior to OV. Incision edges approximated, no redness or edema. Battery status: Good. R-waves 0.18mV. 0 symptom episodes, 0 tachy episodes, 0 pause episodes, 0 brady episodes. 0 AF episodes . Monthly summary reports and ROV with GT PRN

## 2017-05-19 ENCOUNTER — Ambulatory Visit: Payer: Self-pay | Admitting: Neurology

## 2017-05-24 ENCOUNTER — Other Ambulatory Visit (HOSPITAL_COMMUNITY): Payer: Self-pay | Admitting: Nurse Practitioner

## 2017-05-24 DIAGNOSIS — N2889 Other specified disorders of kidney and ureter: Secondary | ICD-10-CM

## 2017-05-25 ENCOUNTER — Telehealth: Payer: Self-pay | Admitting: Cardiology

## 2017-05-25 NOTE — Telephone Encounter (Signed)
Spoke with Freda Munro, a nurse at the detention center. I explained the purpose of the ILR and how the home monitor works. I explained that if he could not have his home monitor next to his bed at his new location or if he did not get cell service than they could send a manual transmission monthly as well as a manual transmission after any symptoms of dizziness, lightheadedness or passing out. I emailed instructions on how to send a manual transmission to [place].

## 2017-05-25 NOTE — Telephone Encounter (Signed)
New Message    1. Has your device fired? no  2. Is you device beeping? no  3. Are you experiencing draining or swelling at device site? no  4. Are you calling to see if we received your device transmission? no  5. Have you passed out? No  Danae Chen from [facility] center is calling on behalf of patient. They are needing to move this inmate and need to know the steps they need to take in moving him. Please call to discuss.     Please route to [place]

## 2017-05-31 ENCOUNTER — Ambulatory Visit (HOSPITAL_COMMUNITY)
Admission: RE | Admit: 2017-05-31 | Discharge: 2017-05-31 | Disposition: A | Discharge status: Home / Self Care | Source: Ambulatory Visit | Attending: Nurse Practitioner | Admitting: Nurse Practitioner

## 2017-05-31 DIAGNOSIS — K8020 Calculus of gallbladder without cholecystitis without obstruction: Secondary | ICD-10-CM | POA: Insufficient documentation

## 2017-05-31 DIAGNOSIS — N2889 Other specified disorders of kidney and ureter: Secondary | ICD-10-CM | POA: Diagnosis not present

## 2017-05-31 MED ORDER — IOPAMIDOL (ISOVUE-300) INJECTION 61%
100.0000 mL | Freq: Once | INTRAVENOUS | Status: AC | PRN
  Administered 2017-05-31: 100 mL via INTRAVENOUS

## 2017-06-05 ENCOUNTER — Ambulatory Visit (INDEPENDENT_AMBULATORY_CARE_PROVIDER_SITE_OTHER): Admitting: *Deleted

## 2017-06-05 DIAGNOSIS — R55 Syncope and collapse: Secondary | ICD-10-CM | POA: Diagnosis not present

## 2017-06-05 NOTE — Progress Notes (Signed)
[facility] Summary Report / Loop Recorder 

## 2017-06-13 ENCOUNTER — Ambulatory Visit (INDEPENDENT_AMBULATORY_CARE_PROVIDER_SITE_OTHER): Admitting: Neurology

## 2017-06-13 ENCOUNTER — Ambulatory Visit: Admitting: Neurology

## 2017-06-13 ENCOUNTER — Telehealth: Payer: Self-pay | Admitting: Neurology

## 2017-06-13 ENCOUNTER — Encounter: Payer: Self-pay | Admitting: Neurology

## 2017-06-13 VITALS — Resp 18 | Ht 73.5 in | Wt 172.0 lb

## 2017-06-13 DIAGNOSIS — F3170 Bipolar disorder, currently in remission, most recent episode unspecified: Secondary | ICD-10-CM | POA: Diagnosis not present

## 2017-06-13 DIAGNOSIS — I25119 Atherosclerotic heart disease of native coronary artery with unspecified angina pectoris: Secondary | ICD-10-CM

## 2017-06-13 DIAGNOSIS — R404 Transient alteration of awareness: Secondary | ICD-10-CM
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
## 2017-06-13 NOTE — Patient Instructions (Signed)
It is unclear if spells are seizures or syncopal events  Continue Keppra at a dose of 750 mg twice daily

## 2017-06-13 NOTE — Telephone Encounter (Signed)
Web designer for [facility] has called asking that it be documented that no appointment information is to be given to this patient.  Stephen [name] states she is the only one that his appointment information is to be given to.  Please call her at [phone]

## 2017-06-13 NOTE — Telephone Encounter (Signed)
LMOM (identified vm) that pt. does not need a return appt. to see RAS/fim

## 2017-06-14 ENCOUNTER — Telehealth: Payer: Self-pay | Admitting: Cardiology

## 2017-06-14 NOTE — Telephone Encounter (Signed)
New Message   Erica at the detention center the pt is at, states that the pt is to never be given any paperwork, he can not be aware of any up coming appointments due to it being dangerous to our staff because they do not know what he is in for. States that if we have any questions to only contact her and if any paperwork needs to be given please only give it to her or fax it.

## 2017-06-15 ENCOUNTER — Telehealth: Payer: Self-pay | Admitting: Cardiology

## 2017-06-15 NOTE — Telephone Encounter (Signed)
Opened in error. Pt is suppose to send monthly reports manually.

## 2017-06-21 ENCOUNTER — Encounter: Payer: Self-pay | Admitting: Cardiology

## 2017-06-30 LAB — CUP PACEART REMOTE DEVICE CHECK
Implantable Pulse Generator Implant Date: 20190325
MDC IDC SESS DTM: 20190427204011

## 2017-07-06 ENCOUNTER — Ambulatory Visit: Admitting: *Deleted

## 2017-07-06 NOTE — Progress Notes (Addendum)
No transmission  

## 2017-07-11 ENCOUNTER — Encounter: Payer: Self-pay | Admitting: Cardiology

## 2017-07-17 ENCOUNTER — Encounter: Payer: Self-pay | Admitting: Physician Assistant

## 2017-07-20 ENCOUNTER — Ambulatory Visit: Admitting: Cardiology

## 2017-07-20 ENCOUNTER — Other Ambulatory Visit: Payer: Self-pay | Admitting: Urology

## 2017-07-25 ENCOUNTER — Telehealth: Payer: Self-pay | Admitting: *Deleted

## 2017-07-25 NOTE — Telephone Encounter (Signed)
   [facility] Pre-operative Risk Assessment    Request for surgical clearance:  1. What type of surgery is being performed? RIGHT ROBOTIC PARTIAL NEPHRECTOMY   2. When is this surgery scheduled? 08-30-17   3. What type of clearance is required (medical clearance vs. Pharmacy clearance to hold med vs. Both)? PHARMACY  4. Are there any medications that need to be held prior to surgery and how long? ASA 5 DAYS   5. Practice name and name of physician performing surgery?  DR.  Tresa Moore.   6. What is your office phone number [phone]     7.   What is your office fax number [phone]  8.   Anesthesia type (None, local, MAC, general) ? NONE   Stephen [name] M 07/25/2017, 4:46 PM  _________________________________________________________________   (provider comments below)

## 2017-07-26 NOTE — Telephone Encounter (Signed)
Called number listed to see if I could get him in for a appointment. The number listed belongs to the jail and they stated that he has been moved to a prison in [place], I called the prison and they stated that the pt's primary provider is in process of having pt's medical care moved closer to the prison and all of his pre surgery care is being taking care of.

## 2017-07-26 NOTE — Telephone Encounter (Signed)
The patient was due to follow up in June and has cancelled several appointments. Can we see if we can get him into the office prior to his surgery on 7/24?

## 2017-07-28 ENCOUNTER — Telehealth: Payer: Self-pay

## 2017-07-28 NOTE — Telephone Encounter (Signed)
Received notice from [facility] regarding 08/30/17 surgery being cancelled and to disregard the request.

## 2017-07-31 ENCOUNTER — Ambulatory Visit: Admitting: Cardiology

## 2017-08-02 ENCOUNTER — Ambulatory Visit: Admitting: Cardiology

## 2017-08-08 ENCOUNTER — Ambulatory Visit (INDEPENDENT_AMBULATORY_CARE_PROVIDER_SITE_OTHER): Admitting: *Deleted

## 2017-08-08 DIAGNOSIS — R55 Syncope and collapse: Secondary | ICD-10-CM

## 2017-08-09 NOTE — Progress Notes (Signed)
[facility] Summary Report / Loop Recorder 

## 2017-08-21 ENCOUNTER — Telehealth: Payer: Self-pay

## 2017-08-21 NOTE — Telephone Encounter (Signed)
   [facility] Pre-operative Risk Assessment    Request for surgical clearance:  1. What type of surgery is being performed? Urologic surgery- a right robotic partial nephrectomy   2. When is this surgery scheduled? Early September 2019   3. What type of clearance is required (medical clearance vs. Pharmacy clearance to hold med vs. Both)?  Both  4. Are there any medications that need to be held prior to surgery and how long? ASA 81 mg how long to hold   5. Practice name and name of physician performing surgery?  [facility], Dr. Jess Barters  6. What is your office phone number [phone]    7.   What is your office fax number [phone]  [facility] Urology Surgery Scheduler  8.   Anesthesia type (None, local, MAC, general) ? Unknown   Michaelyn Barter 08/21/2017, 4:05 PM  _________________________________________________________________   (provider comments below)

## 2017-08-22 NOTE — Telephone Encounter (Signed)
Dr Marlou Porch this [facility] is in the [facility] prison in [place]. Unlikely we are going to see him pre op, Nicki Reaper did see him a few months ago and he was stable. Can you please comment on how long his ASA can be held pre nephrectomy so we can clear him for surgery.   Kerin Ransom PA-C 08/22/2017 3:30 PM

## 2017-08-23 NOTE — Telephone Encounter (Signed)
   Primary Cardiologist: Candee Furbish, MD  Chart reviewed and case discussed with Dr Marlou Porch as part of pre-operative protocol coverage. Given past medical history and time since last visit, based on ACC/AHA guidelines, Laiken B Mikami would be at acceptable risk for the planned procedure without further cardiovascular testing.   OK to hold ASA 5 days pre op.  I will route this recommendation to the requesting party via Epic fax function and remove from pre-op pool.  Please call with questions.  Kerin Ransom, PA-C 08/23/2017, 4:18 PM

## 2017-08-23 NOTE — Telephone Encounter (Signed)
He may hold aspirin for 5 days prior to procedure. Thanks Candee Furbish, MD

## 2017-08-30 ENCOUNTER — Inpatient Hospital Stay: Admit: 2017-08-30 | Admitting: Urology

## 2017-08-30 SURGERY — NEPHRECTOMY, PARTIAL, ROBOT-ASSISTED
Anesthesia: General | Laterality: Right

## 2017-09-05 LAB — CUP PACEART REMOTE DEVICE CHECK
MDC IDC PG IMPLANT DT: 20190325
MDC IDC SESS DTM: 20190702213821

## 2017-09-11 ENCOUNTER — Ambulatory Visit (INDEPENDENT_AMBULATORY_CARE_PROVIDER_SITE_OTHER): Admitting: *Deleted

## 2017-09-11 DIAGNOSIS — R55 Syncope and collapse: Secondary | ICD-10-CM

## 2017-09-12 NOTE — Progress Notes (Signed)
[facility] Summary Report / Loop Recorder 

## 2017-10-13 ENCOUNTER — Ambulatory Visit (INDEPENDENT_AMBULATORY_CARE_PROVIDER_SITE_OTHER): Admitting: *Deleted

## 2017-10-13 DIAGNOSIS — R55 Syncope and collapse: Secondary | ICD-10-CM

## 2017-10-14 NOTE — Progress Notes (Signed)
[facility] Summary Report / Loop Recorder 

## 2017-10-23 LAB — CUP PACEART REMOTE DEVICE CHECK
Date Time Interrogation Session: 20190804220926
MDC IDC PG IMPLANT DT: 20190325

## 2017-11-03 LAB — CUP PACEART REMOTE DEVICE CHECK
Implantable Pulse Generator Implant Date: 20190325
MDC IDC SESS DTM: 20190906221024

## 2017-11-16 ENCOUNTER — Ambulatory Visit (INDEPENDENT_AMBULATORY_CARE_PROVIDER_SITE_OTHER): Admitting: *Deleted

## 2017-11-16 DIAGNOSIS — R55 Syncope and collapse: Secondary | ICD-10-CM | POA: Diagnosis not present

## 2017-11-16 NOTE — Progress Notes (Signed)
[facility] Summary Report / Loop Recorder 

## 2017-12-04 LAB — CUP PACEART REMOTE DEVICE CHECK
Date Time Interrogation Session: 20191009223613
Implantable Pulse Generator Implant Date: 20190325

## 2017-12-18 ENCOUNTER — Ambulatory Visit (INDEPENDENT_AMBULATORY_CARE_PROVIDER_SITE_OTHER): Admitting: *Deleted

## 2017-12-18 DIAGNOSIS — I10 Essential (primary) hypertension: Secondary | ICD-10-CM

## 2017-12-18 DIAGNOSIS — R55 Syncope and collapse: Secondary | ICD-10-CM | POA: Diagnosis not present

## 2017-12-19 NOTE — Progress Notes (Signed)
[facility] Summary Report / Loop Recorder 

## 2018-01-22 ENCOUNTER — Ambulatory Visit (INDEPENDENT_AMBULATORY_CARE_PROVIDER_SITE_OTHER)

## 2018-01-22 DIAGNOSIS — R55 Syncope and collapse: Secondary | ICD-10-CM | POA: Diagnosis not present

## 2018-01-22 NOTE — Progress Notes (Signed)
[facility] Summary Report / Loop Recorder 

## 2018-02-06 LAB — CUP PACEART REMOTE DEVICE CHECK
Implantable Pulse Generator Implant Date: 20190325
MDC IDC SESS DTM: 20191111231114

## 2018-02-22 ENCOUNTER — Ambulatory Visit (INDEPENDENT_AMBULATORY_CARE_PROVIDER_SITE_OTHER)

## 2018-02-22 DIAGNOSIS — R55 Syncope and collapse: Secondary | ICD-10-CM | POA: Diagnosis not present

## 2018-02-23 NOTE — Progress Notes (Signed)
[facility] Summary Report / Loop Recorder 

## 2018-02-24 LAB — CUP PACEART REMOTE DEVICE CHECK
Date Time Interrogation Session: 20200116233934
MDC IDC PG IMPLANT DT: 20190325

## 2018-02-25 LAB — CUP PACEART REMOTE DEVICE CHECK
Date Time Interrogation Session: 20191214230648
Implantable Pulse Generator Implant Date: 20190325

## 2018-03-07 ENCOUNTER — Telehealth: Payer: Self-pay

## 2018-03-07 NOTE — Telephone Encounter (Signed)
Spoke with [facility] and was told there was no record of a person there with this name or DOB, also contacted pts friend listed she did not have knowledge of pts location.

## 2018-03-27 ENCOUNTER — Ambulatory Visit (INDEPENDENT_AMBULATORY_CARE_PROVIDER_SITE_OTHER)

## 2018-03-27 DIAGNOSIS — R55 Syncope and collapse: Secondary | ICD-10-CM

## 2018-03-29 LAB — CUP PACEART REMOTE DEVICE CHECK
Date Time Interrogation Session: 20200218233921
Implantable Pulse Generator Implant Date: 20190325

## 2018-04-04 NOTE — Progress Notes (Signed)
[facility] Summary Report / Loop Recorder 

## 2018-04-13 ENCOUNTER — Other Ambulatory Visit: Payer: Self-pay | Admitting: Internal Medicine

## 2018-04-30 ENCOUNTER — Other Ambulatory Visit: Payer: Self-pay

## 2018-04-30 ENCOUNTER — Ambulatory Visit (INDEPENDENT_AMBULATORY_CARE_PROVIDER_SITE_OTHER): Admitting: *Deleted

## 2018-04-30 DIAGNOSIS — R55 Syncope and collapse: Secondary | ICD-10-CM | POA: Diagnosis not present

## 2018-05-01 LAB — CUP PACEART REMOTE DEVICE CHECK
Date Time Interrogation Session: 20200322234329
Implantable Pulse Generator Implant Date: 20190325

## 2018-05-09 NOTE — Progress Notes (Signed)
[facility] Summary Report / Loop Recorder 

## 2018-06-01 ENCOUNTER — Ambulatory Visit (INDEPENDENT_AMBULATORY_CARE_PROVIDER_SITE_OTHER): Admitting: *Deleted

## 2018-06-01 ENCOUNTER — Other Ambulatory Visit: Payer: Self-pay

## 2018-06-01 DIAGNOSIS — R55 Syncope and collapse: Secondary | ICD-10-CM

## 2018-06-02 LAB — CUP PACEART REMOTE DEVICE CHECK
Date Time Interrogation Session: 20200424234006
Implantable Pulse Generator Implant Date: 20190325

## 2018-06-08 NOTE — Progress Notes (Signed)
[facility] Summary Report / Loop Recorder 

## 2018-07-04 ENCOUNTER — Ambulatory Visit (INDEPENDENT_AMBULATORY_CARE_PROVIDER_SITE_OTHER): Admitting: *Deleted

## 2018-07-04 DIAGNOSIS — R55 Syncope and collapse: Secondary | ICD-10-CM

## 2018-07-05 LAB — CUP PACEART REMOTE DEVICE CHECK
Date Time Interrogation Session: 20200528000947
Implantable Pulse Generator Implant Date: 20190325

## 2018-07-13 NOTE — Progress Notes (Signed)
[facility] Summary Report / Loop Recorder 

## 2018-08-06 ENCOUNTER — Ambulatory Visit (INDEPENDENT_AMBULATORY_CARE_PROVIDER_SITE_OTHER): Admitting: *Deleted

## 2018-08-06 DIAGNOSIS — R55 Syncope and collapse: Secondary | ICD-10-CM

## 2018-08-07 LAB — CUP PACEART REMOTE DEVICE CHECK
Date Time Interrogation Session: 20200630003904
Implantable Pulse Generator Implant Date: 20190325

## 2018-08-18 NOTE — Progress Notes (Signed)
[facility] Summary Report / Loop Recorder 

## 2018-08-20 ENCOUNTER — Telehealth: Payer: Self-pay

## 2018-08-20 NOTE — Telephone Encounter (Signed)
Left message for patient regarding disconnected monitor.  

## 2018-09-10 ENCOUNTER — Encounter: Admitting: *Deleted

## 2018-11-22 ENCOUNTER — Telehealth: Payer: Self-pay | Admitting: Internal Medicine

## 2018-11-22 NOTE — Telephone Encounter (Signed)
New Message:  Please call, concerning pt's Loop Recorder.

## 2018-11-22 NOTE — Telephone Encounter (Signed)
Pt monitor was lost while being transferred to a different prison. I gave Stephen [name] the number to [facility] tech support to order the pt a new monitor and to get answer any questions she may have on the cost of the monitor.

## 2018-12-05 IMAGING — CT CT HEAD W/O CM
4 series · 17 of 47 positions shown, 19 images · non-contrast
Comparison: Brain MRI 04/04/2017

CLINICAL DATA: Syncope and fall.  Facial trauma.

EXAM:
CT HEAD WITHOUT CONTRAST
TECHNIQUE: Contiguous axial images were obtained from the base of the skull
through the vertex without intravenous contrast.

[Series 3: head without · axial · non-contrast · 0.44mm/px · z∈[+1400,+1540]mm · 7 of 38 slices shown, 9 images]
[im 5/38  brain]
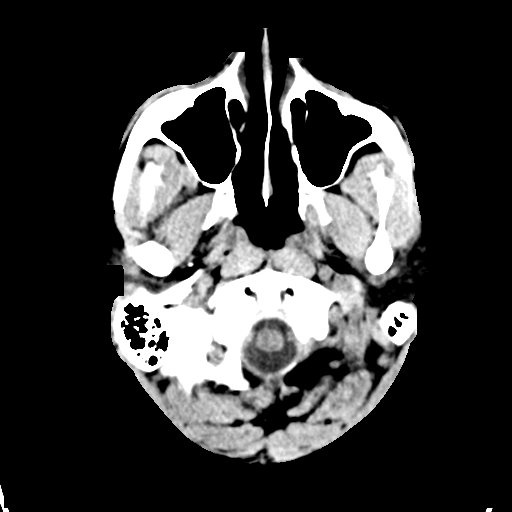
[im 5/38  bone]
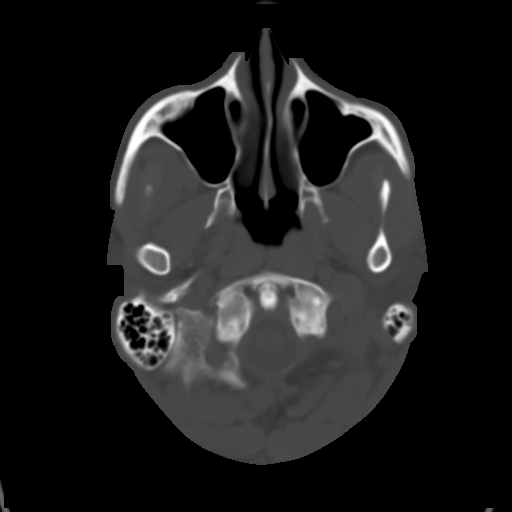
[im 10/38  brain]
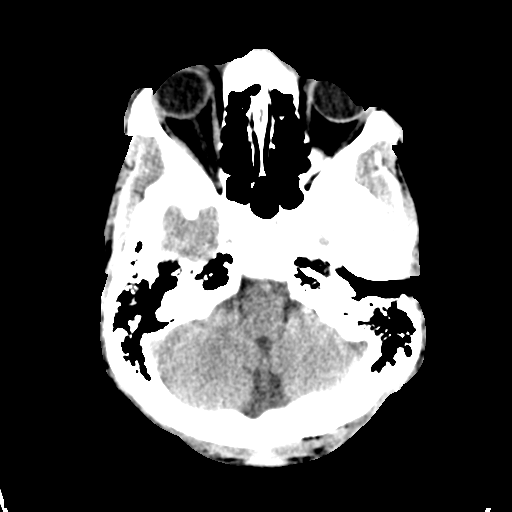
[im 14/38  brain]
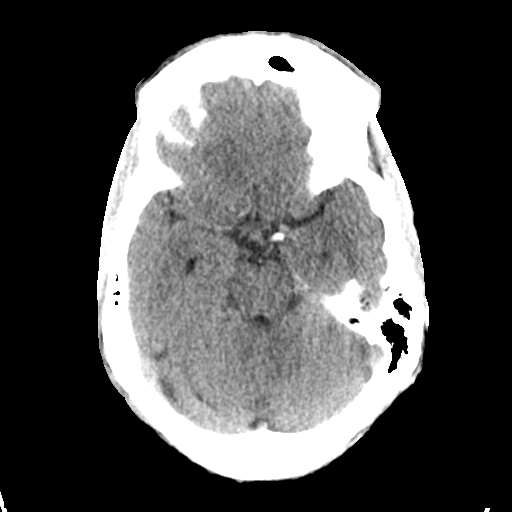
[im 19/38  brain]
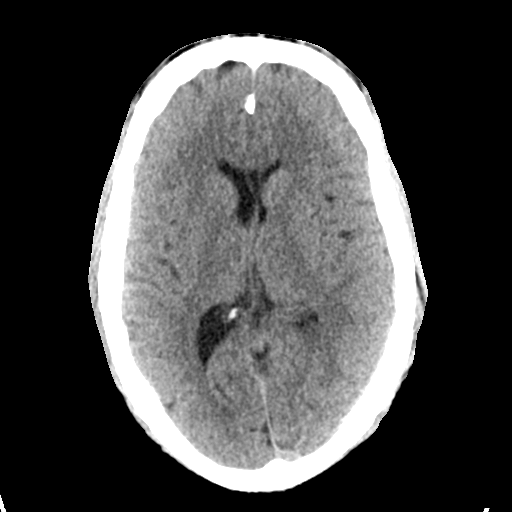
[im 24/38  brain]
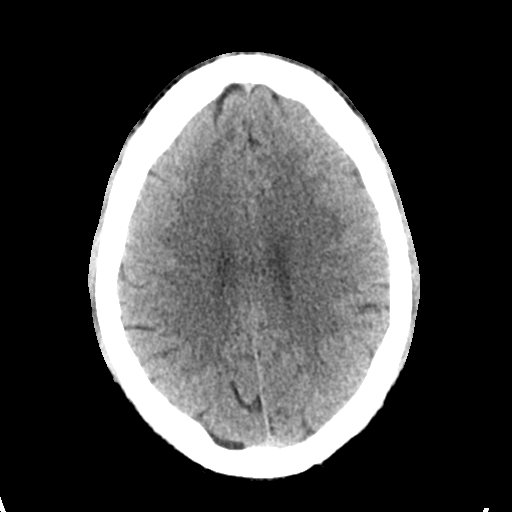
[im 24/38  bone]
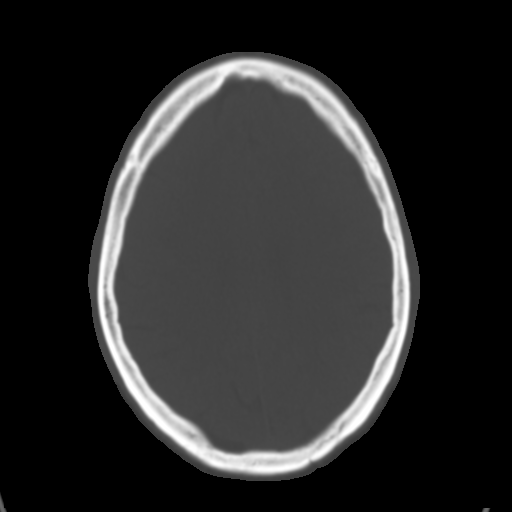
[im 28/38  brain]
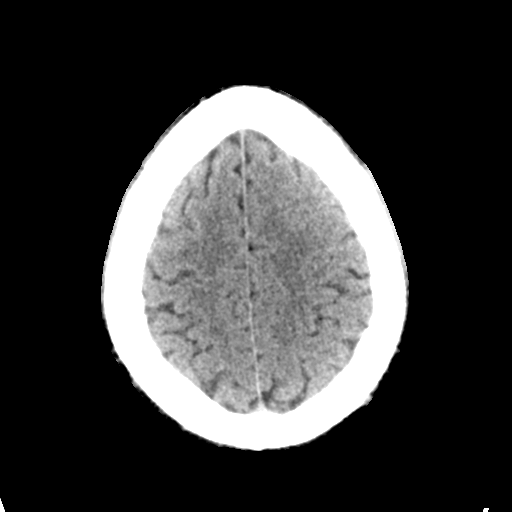
[im 33/38  brain]
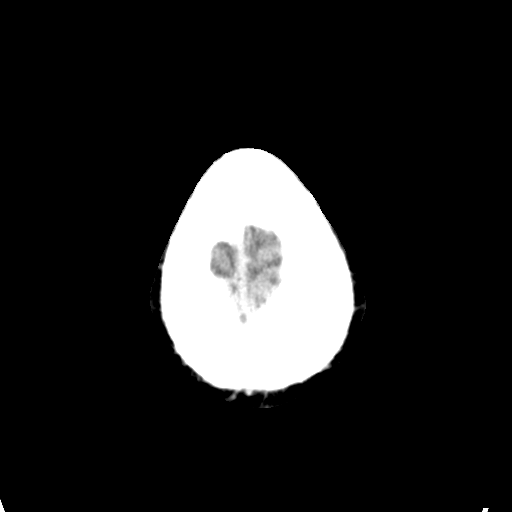

[Series 4: head bone · axial · 0.44mm/px · z∈[+1398,+1464]mm · 4 of 95 slices shown]
[im 10/95  bone]
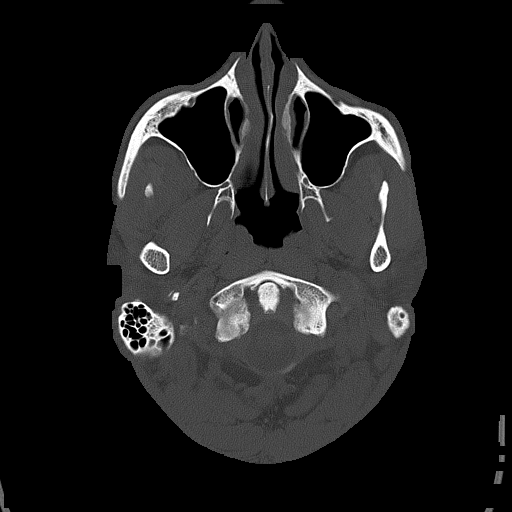
[im 19/95  bone]
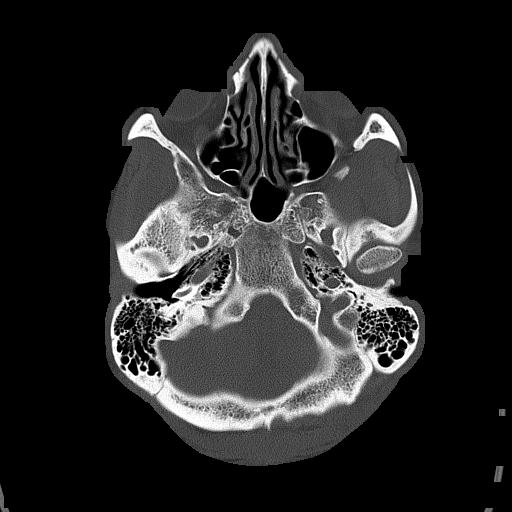
[im 29/95  bone]
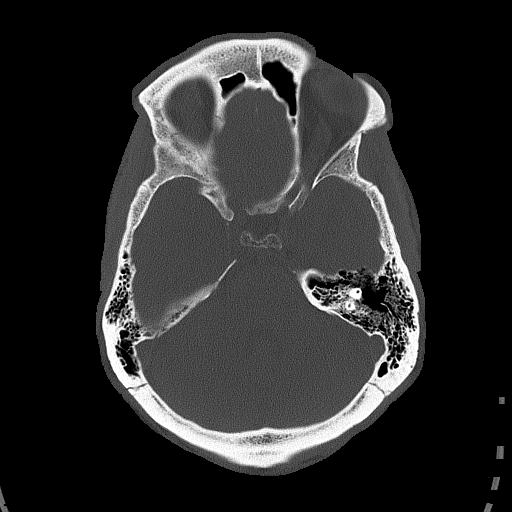
[im 43/95  bone]
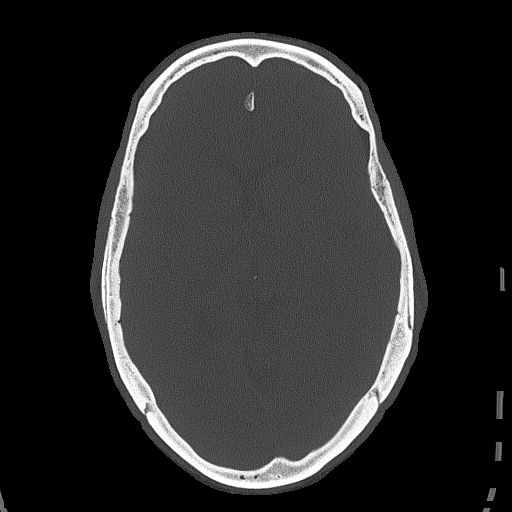

[Series 5: head without cor · coronal · non-contrast · 0.37mm/px · 3 of 71 slices shown]
[im 24/71  brain]
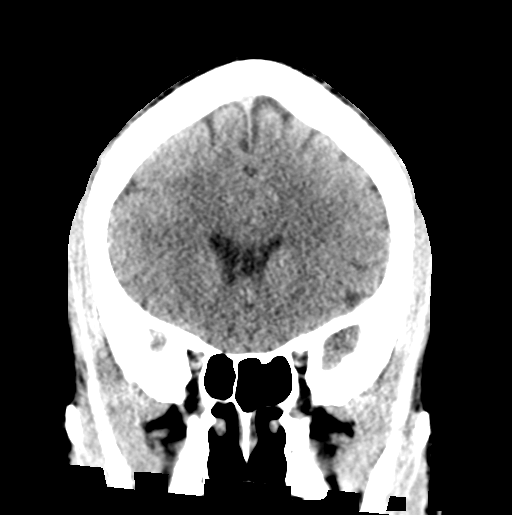
[im 32/71  brain]
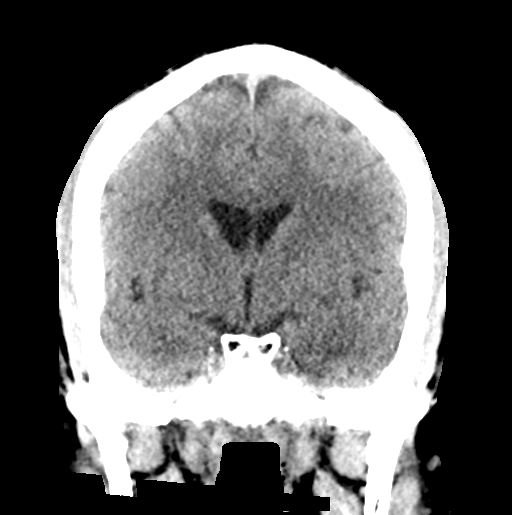
[im 39/71  brain]
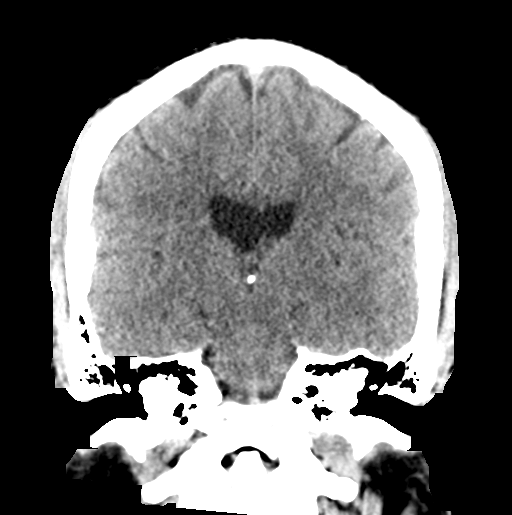

[Series 6: head without sag · sagittal · non-contrast · 0.37mm/px · 3 of 59 slices shown]
[im 20/59  brain]
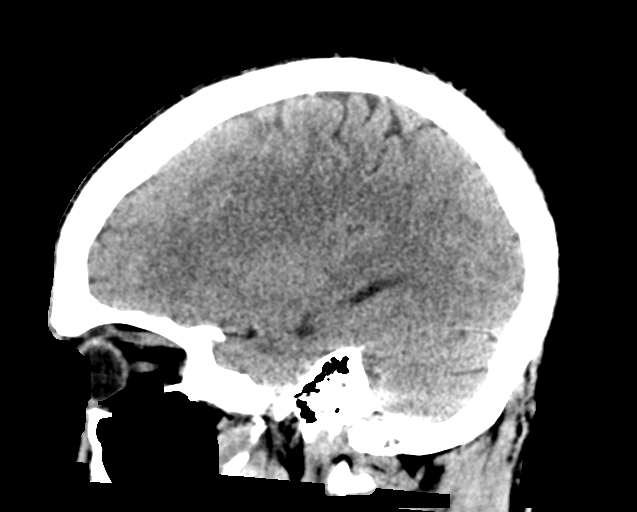
[im 30/59  brain]
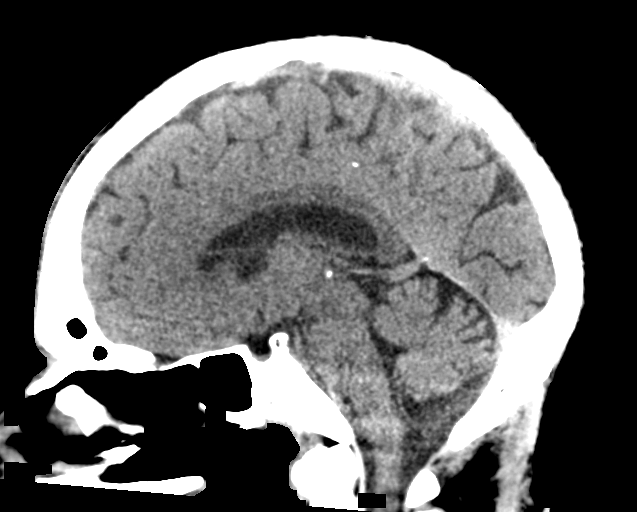
[im 39/59  brain]
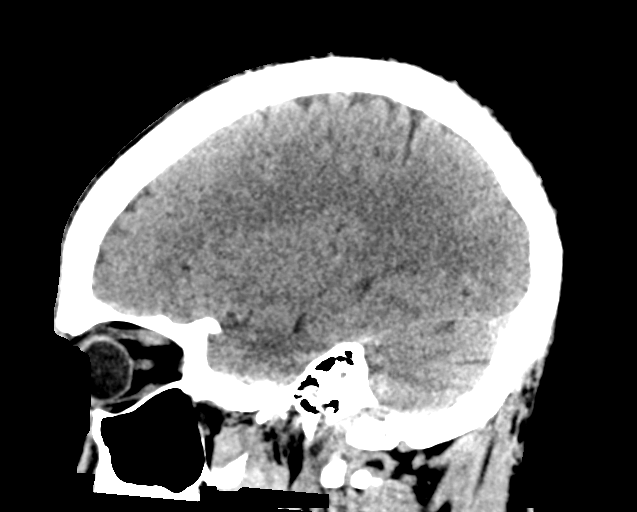

[17 of 47 positions shown; findings below may reference images not displayed]

FINDINGS: Brain: No mass lesion, intraparenchymal hemorrhage or extra-axial
collection. No evidence of acute cortical infarct. Normal appearance
of the brain parenchyma and extra axial spaces for age.

Vascular: No hyperdense vessel or unexpected vascular calcification.

Skull: Normal visualized skull base, calvarium and extracranial soft
tissues.

Sinuses/Orbits: No sinus fluid levels or advanced mucosal
thickening. No mastoid effusion. Normal orbits.
IMPRESSION: Normal head CT.

## 2018-12-17 ENCOUNTER — Ambulatory Visit (INDEPENDENT_AMBULATORY_CARE_PROVIDER_SITE_OTHER): Payer: Self-pay | Admitting: *Deleted

## 2018-12-17 DIAGNOSIS — R55 Syncope and collapse: Secondary | ICD-10-CM

## 2018-12-17 IMAGING — DX DG CHEST 2V
3 series · 3 of 3 positions shown · non-contrast
Comparison: 04/19/2017

CLINICAL DATA: Left-sided chest pain which shortness-of-breath,
nausea, vomiting and dizziness 1 day.

EXAM:
CHEST - 2 VIEW

[chest lat]
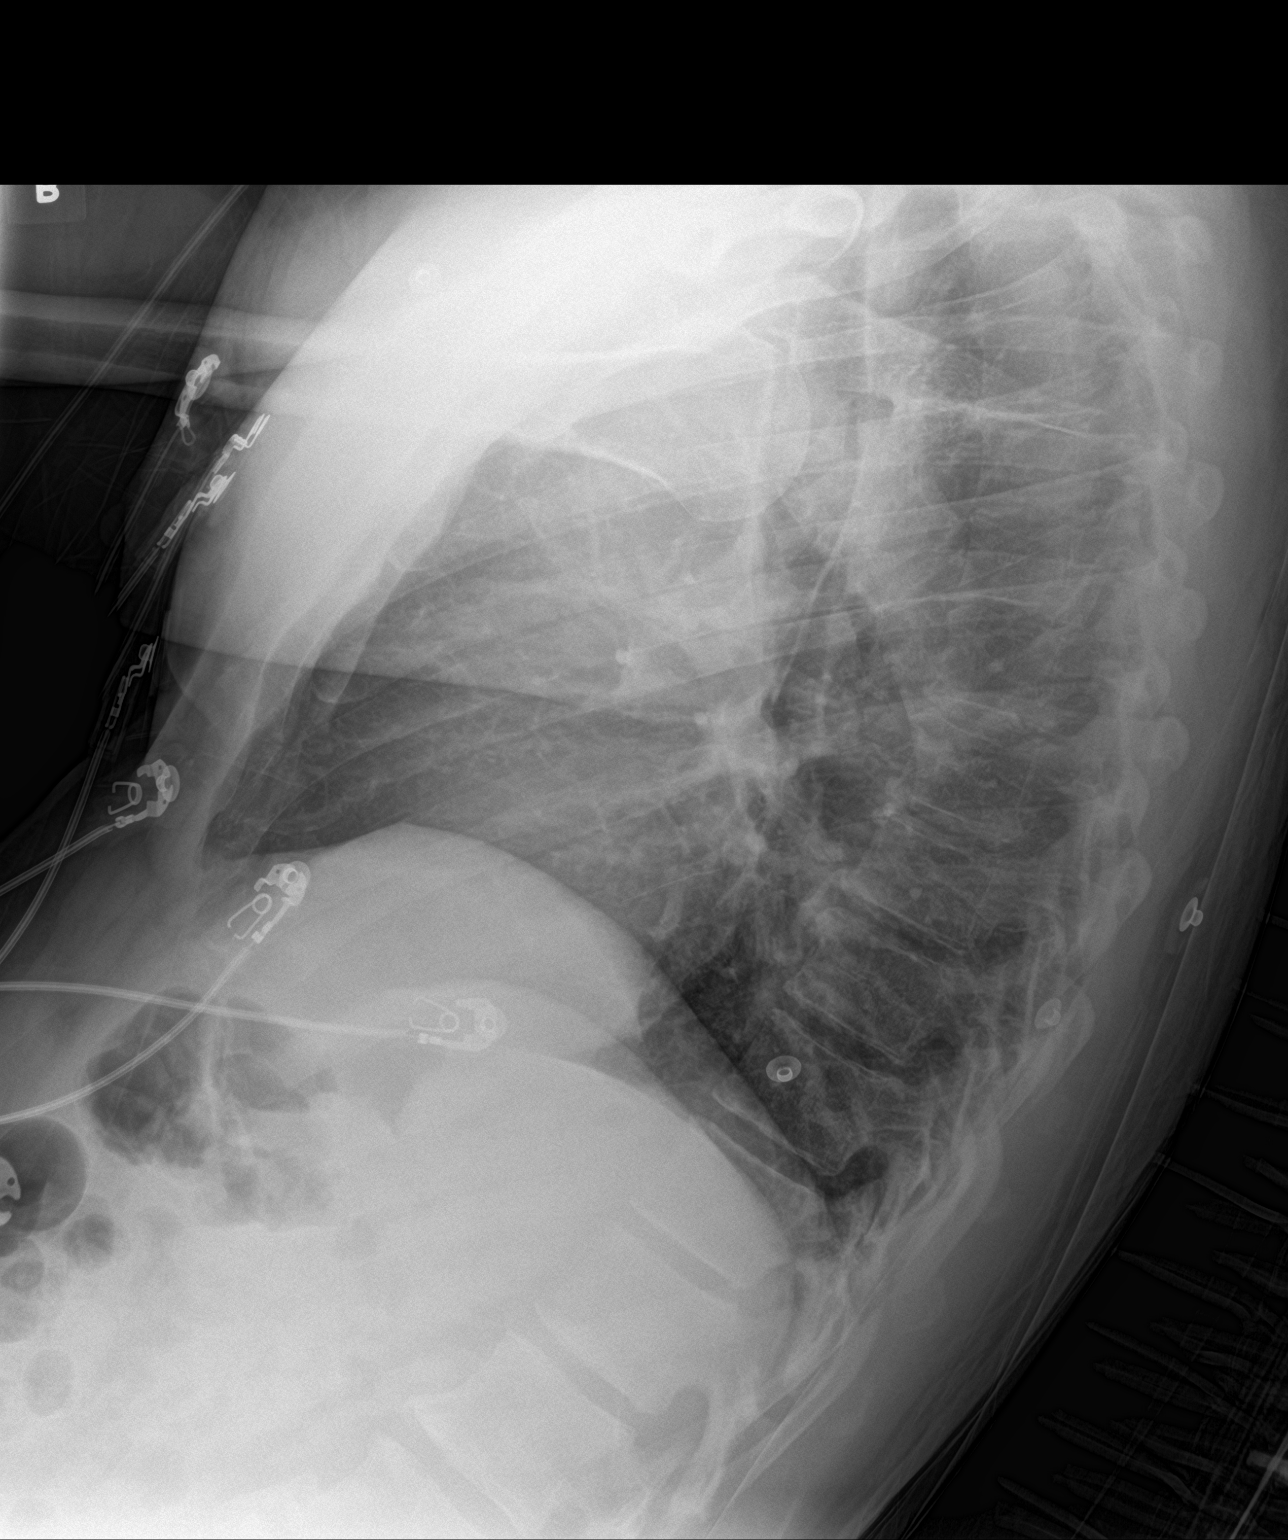

[chest ap (1 of 2)]
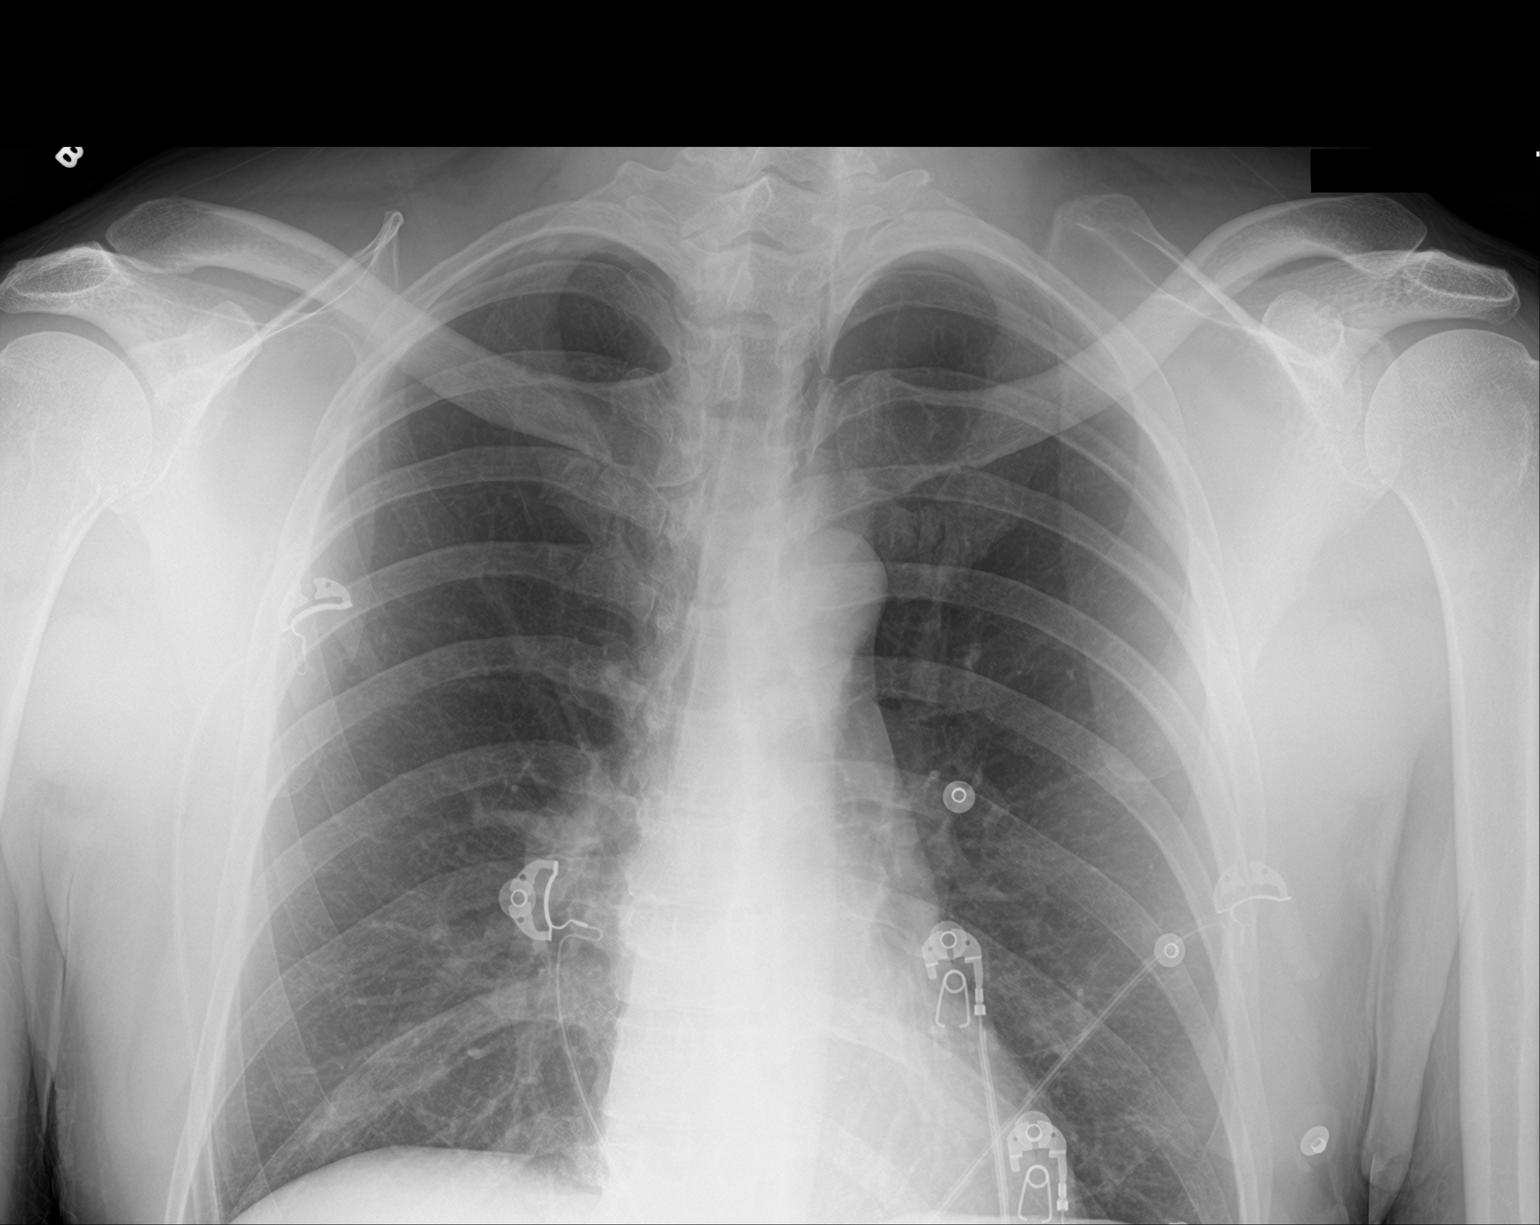

[chest ap (2 of 2)]
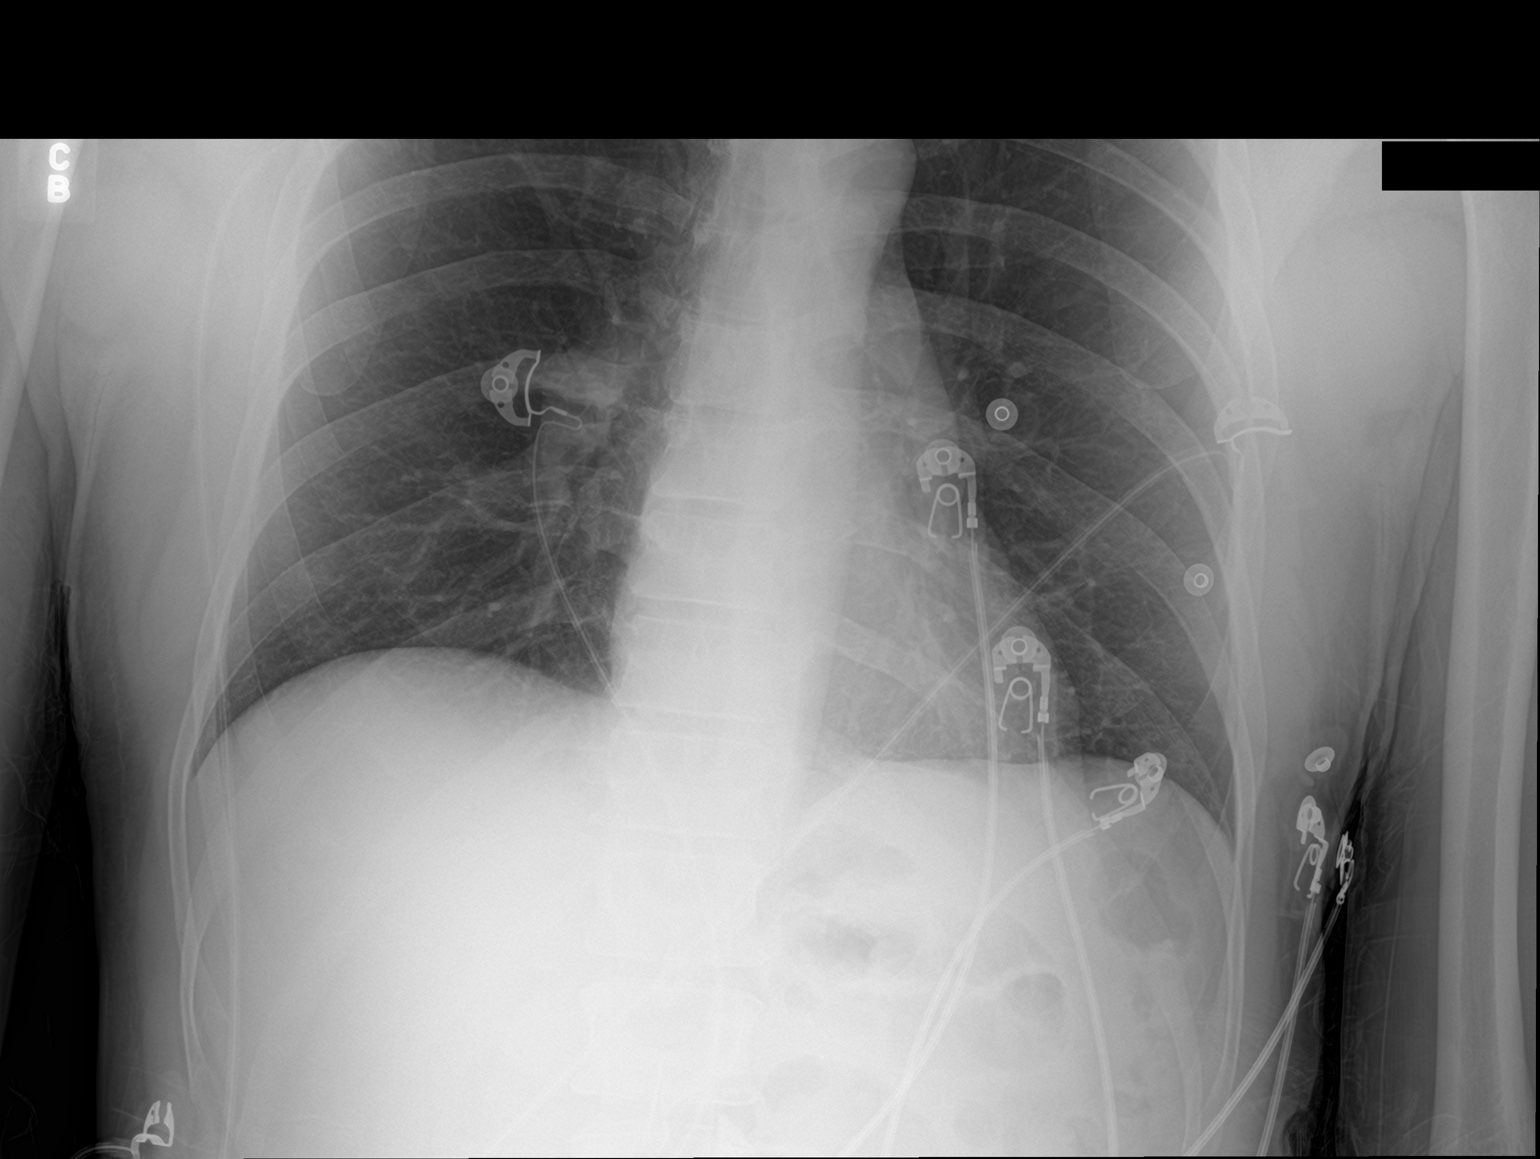

[3 of 3 positions shown; findings below may reference images not displayed]

FINDINGS: Lungs are well inflated without focal airspace consolidation or
effusion. Cardiomediastinal silhouette is within normal. There is
mild degenerate change of the spine.
IMPRESSION: No active cardiopulmonary disease.

## 2018-12-18 LAB — CUP PACEART REMOTE DEVICE CHECK
Date Time Interrogation Session: 20201109014054
Implantable Pulse Generator Implant Date: 20190325

## 2019-01-16 NOTE — Progress Notes (Signed)
[facility] Summary Report / Loop Recorder 

## 2019-01-18 ENCOUNTER — Ambulatory Visit (INDEPENDENT_AMBULATORY_CARE_PROVIDER_SITE_OTHER): Payer: Self-pay | Admitting: *Deleted

## 2019-01-18 DIAGNOSIS — R404 Transient alteration of awareness: Secondary | ICD-10-CM

## 2019-01-20 LAB — CUP PACEART REMOTE DEVICE CHECK
Date Time Interrogation Session: 20201213000500
Implantable Pulse Generator Implant Date: 20190325

## 2019-01-21 LAB — CUP PACEART REMOTE DEVICE CHECK
Date Time Interrogation Session: 20201211204435
Implantable Pulse Generator Implant Date: 20190325

## 2019-01-28 ENCOUNTER — Telehealth: Payer: Self-pay | Admitting: Emergency Medicine

## 2019-01-28 NOTE — Telephone Encounter (Signed)
Patient will be scheduled for device clinic appointment to address TWOS.

## 2019-01-30 NOTE — Telephone Encounter (Signed)
Scheduler attempted to contact patient. Patient currently incarcerated. Remote transmissions being received.

## 2019-02-20 ENCOUNTER — Ambulatory Visit (INDEPENDENT_AMBULATORY_CARE_PROVIDER_SITE_OTHER): Payer: Self-pay | Admitting: *Deleted

## 2019-02-20 DIAGNOSIS — R404 Transient alteration of awareness: Secondary | ICD-10-CM

## 2019-02-21 LAB — CUP PACEART REMOTE DEVICE CHECK
Date Time Interrogation Session: 20210113223119
Implantable Pulse Generator Implant Date: 20190325

## 2019-03-25 ENCOUNTER — Ambulatory Visit (INDEPENDENT_AMBULATORY_CARE_PROVIDER_SITE_OTHER): Payer: Self-pay | Admitting: *Deleted

## 2019-03-25 DIAGNOSIS — R404 Transient alteration of awareness: Secondary | ICD-10-CM

## 2019-03-25 LAB — CUP PACEART REMOTE DEVICE CHECK
Date Time Interrogation Session: 20210214233152
Implantable Pulse Generator Implant Date: 20190325

## 2019-03-26 NOTE — Progress Notes (Signed)
ILR Remote 

## 2019-06-09 ENCOUNTER — Emergency Department (HOSPITAL_COMMUNITY)
Admission: EM | Admit: 2019-06-09 | Discharge: 2019-06-09 | Disposition: A | Discharge status: Home / Self Care | Payer: Self-pay | Attending: Emergency Medicine | Admitting: Emergency Medicine

## 2019-06-09 ENCOUNTER — Emergency Department (HOSPITAL_COMMUNITY): Payer: Self-pay

## 2019-06-09 ENCOUNTER — Other Ambulatory Visit: Payer: Self-pay

## 2019-06-09 DIAGNOSIS — Z79899 Other long term (current) drug therapy: Secondary | ICD-10-CM | POA: Insufficient documentation

## 2019-06-09 DIAGNOSIS — T50901A Poisoning by unspecified drugs, medicaments and biological substances, accidental (unintentional), initial encounter: Secondary | ICD-10-CM | POA: Insufficient documentation

## 2019-06-09 DIAGNOSIS — I10 Essential (primary) hypertension: Secondary | ICD-10-CM | POA: Insufficient documentation

## 2019-06-09 DIAGNOSIS — Z7982 Long term (current) use of aspirin: Secondary | ICD-10-CM | POA: Insufficient documentation

## 2019-06-09 DIAGNOSIS — Z87891 Personal history of nicotine dependence: Secondary | ICD-10-CM | POA: Insufficient documentation

## 2019-06-09 DIAGNOSIS — E119 Type 2 diabetes mellitus without complications: Secondary | ICD-10-CM | POA: Insufficient documentation

## 2019-06-09 DIAGNOSIS — I2510 Atherosclerotic heart disease of native coronary artery without angina pectoris: Secondary | ICD-10-CM | POA: Insufficient documentation

## 2019-06-09 DIAGNOSIS — Z7984 Long term (current) use of oral hypoglycemic drugs: Secondary | ICD-10-CM | POA: Insufficient documentation

## 2019-06-09 LAB — COMPREHENSIVE METABOLIC PANEL
ALT: 23 U/L (ref 0–44)
AST: 31 U/L (ref 15–41)
Albumin: 3.5 g/dL (ref 3.5–5.0)
Alkaline Phosphatase: 60 U/L (ref 38–126)
Anion gap: 7 (ref 5–15)
BUN: 15 mg/dL (ref 6–20)
CO2: 24 mmol/L (ref 22–32)
Calcium: 8.1 mg/dL — ABNORMAL LOW (ref 8.9–10.3)
Chloride: 105 mmol/L (ref 98–111)
Creatinine, Ser: 1.16 mg/dL (ref 0.61–1.24)
GFR calc Af Amer: >60 mL/min (ref >60)
GFR calc non Af Amer: >60 mL/min (ref >60)
Glucose, Bld: 101 mg/dL — ABNORMAL HIGH (ref 70–99)
Potassium: 4.6 mmol/L (ref 3.5–5.1)
Sodium: 136 mmol/L (ref 135–145)
Total Bilirubin: 0.6 mg/dL (ref 0.3–1.2)
Total Protein: 6.7 g/dL (ref 6.5–8.1)

## 2019-06-09 LAB — URINALYSIS, ROUTINE W REFLEX MICROSCOPIC
Bacteria, UA: NONE SEEN
Bilirubin Urine: NEGATIVE
Glucose, UA: NEGATIVE mg/dL
Hgb urine dipstick: NEGATIVE
Ketones, ur: 20 mg/dL — AB
Leukocytes,Ua: NEGATIVE
Nitrite: NEGATIVE
Protein, ur: 30 mg/dL — AB
Specific Gravity, Urine: 1.026 (ref 1.005–1.030)
pH: 6 (ref 5.0–8.0)

## 2019-06-09 LAB — SALICYLATE LEVEL: Salicylate Lvl: <7 mg/dL — ABNORMAL LOW (ref 7.0–30.0)

## 2019-06-09 LAB — CBC WITH DIFFERENTIAL/PLATELET
Abs Immature Granulocytes: 0.06 10*3/uL (ref 0.00–0.07)
Basophils Absolute: 0 10*3/uL (ref 0.0–0.1)
Basophils Relative: 0 %
Eosinophils Absolute: 0 10*3/uL (ref 0.0–0.5)
Eosinophils Relative: 0 %
HCT: 46.4 % (ref 39.0–52.0)
Hemoglobin: 15.2 g/dL (ref 13.0–17.0)
Immature Granulocytes: 1 %
Lymphocytes Relative: 8 %
Lymphs Abs: 0.6 10*3/uL — ABNORMAL LOW (ref 0.7–4.0)
MCH: 31.4 pg (ref 26.0–34.0)
MCHC: 32.8 g/dL (ref 30.0–36.0)
MCV: 95.9 fL (ref 80.0–100.0)
Monocytes Absolute: 0.5 10*3/uL (ref 0.1–1.0)
Monocytes Relative: 6 %
Neutro Abs: 6.1 10*3/uL (ref 1.7–7.7)
Neutrophils Relative %: 85 %
Platelets: 169 10*3/uL (ref 150–400)
RBC: 4.84 MIL/uL (ref 4.22–5.81)
RDW: 13 % (ref 11.5–15.5)
WBC: 7.2 10*3/uL (ref 4.0–10.5)
nRBC: 0 % (ref 0.0–0.2)

## 2019-06-09 LAB — TROPONIN I (HIGH SENSITIVITY)
Troponin I (High Sensitivity): 21 ng/L — ABNORMAL HIGH (ref <18)
Troponin I (High Sensitivity): 23 ng/L — ABNORMAL HIGH (ref <18)
Troponin I (High Sensitivity): 24 ng/L — ABNORMAL HIGH (ref <18)

## 2019-06-09 LAB — ETHANOL: Alcohol, Ethyl (B): <10 mg/dL (ref <10)

## 2019-06-09 LAB — ACETAMINOPHEN LEVEL: Acetaminophen (Tylenol), Serum: <10 ug/mL — ABNORMAL LOW (ref 10–30)

## 2019-06-09 LAB — CK
Total CK: 409 U/L — ABNORMAL HIGH (ref 49–397)
Total CK: 322 U/L (ref 49–397)

## 2019-06-09 MED ORDER — SODIUM CHLORIDE 0.9 % IV BOLUS
1000.0000 mL | Freq: Once | INTRAVENOUS | Status: AC
  Administered 2019-06-09: 1000 mL via INTRAVENOUS

## 2019-06-09 MED ORDER — LORAZEPAM 2 MG/ML IJ SOLN: 1.0000 mg | Freq: Once | INTRAMUSCULAR | Status: DC

## 2019-06-09 MED ORDER — NALOXONE HCL 2 MG/2ML IJ SOSY
1.0000 mg | PREFILLED_SYRINGE | Freq: Once | INTRAMUSCULAR | Status: AC
  Administered 2019-06-09: 1 mg via INTRAVENOUS
  Filled 2019-06-09: qty 2

## 2019-06-09 NOTE — ED Provider Notes (Signed)
Clinical Course as of Jun 08 1749  Sun Jun 09, 2019  0712 54 yo male presenting with suspected drug intoxication, snorting unclear substance earlier, given versed by EMS for agitation in the field, arrived somnolent and given narcan here with some improval.  Pending repeat troponin and CK level, and reassessment.  On my initial assessment the patient is sleeping but arousable, satting 99% on room air.   [MT]  0814 Trops flat, CK trending down   [MT]  1007 Patient awake, ambulating to bathroom, eating food.  He has been observed for nearly 8 hours in the ED, and is well outside the window of rebound opioid overdose after his 0230 narcan dose. I believe he is medically stable for discharge   [MT]    Clinical Course User Index [MT] Ephrata Verville, Carola Rhine, MD      Wyvonnia Dusky, MD 06/09/19 [phone]

## 2019-06-09 NOTE — ED Triage Notes (Signed)
Arrived by EMS from residence other. Patient reports he "found a substance that I thought was cocaine". Patient snorted substance and reports it felt like his skin was crawling ans like he was being eaten alive. Patent was thrashing in ambulance and received 2.5 mg of Midazolam IV en route to this facility.   BP:138/82 Pulse:74 Resp:16 O2: 97% CBG:109
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
## 2019-06-09 NOTE — ED Notes (Signed)
PT able to ambulate with cane to bathroom and back

## 2019-06-09 NOTE — Discharge Instructions (Addendum)
You very likely had a drug overdose tonight.  This could have been life-threatening.  You told us that you snorted a random powder on the street.  I strongly recommend that you never do this again.  Some drugs can be quickly lethal.    Keep drinking lots of water today.  Take tylenol at home for your aches and pain.  Follow up with your doctor in 1-2 days.  Return to the ED if you develop chest pain, shortness of breath, or any other significant medical concerns.

## 2023-09-08 ENCOUNTER — Emergency Department (HOSPITAL_COMMUNITY)
Admission: EM | Admit: 2023-09-08 | Discharge: 2023-09-08 | Discharge status: Against Medical Advice | Payer: Self-pay | Attending: Emergency Medicine | Admitting: Emergency Medicine

## 2023-09-08 ENCOUNTER — Emergency Department (HOSPITAL_COMMUNITY): Payer: Self-pay

## 2023-09-08 ENCOUNTER — Other Ambulatory Visit: Payer: Self-pay

## 2023-09-08 ENCOUNTER — Encounter (HOSPITAL_COMMUNITY): Payer: Self-pay | Admitting: Emergency Medicine

## 2023-09-08 DIAGNOSIS — R0602 Shortness of breath: Secondary | ICD-10-CM | POA: Insufficient documentation

## 2023-09-08 DIAGNOSIS — R079 Chest pain, unspecified: Secondary | ICD-10-CM | POA: Insufficient documentation

## 2023-09-08 DIAGNOSIS — Z5321 Procedure and treatment not carried out due to patient leaving prior to being seen by health care provider: Secondary | ICD-10-CM | POA: Insufficient documentation

## 2023-09-08 LAB — CBC
HCT: 37 % — ABNORMAL LOW (ref 39.0–52.0)
Hemoglobin: 12.5 g/dL — ABNORMAL LOW (ref 13.0–17.0)
MCH: 31.2 pg (ref 26.0–34.0)
MCHC: 33.8 g/dL (ref 30.0–36.0)
MCV: 92.3 fL (ref 80.0–100.0)
Platelets: 157 K/uL (ref 150–400)
RBC: 4.01 MIL/uL — ABNORMAL LOW (ref 4.22–5.81)
RDW: 13.9 % (ref 11.5–15.5)
WBC: 7.1 K/uL (ref 4.0–10.5)
nRBC: 0 % (ref 0.0–0.2)

## 2023-09-08 LAB — BASIC METABOLIC PANEL WITH GFR
Anion gap: 9 (ref 5–15)
BUN: 13 mg/dL (ref 6–20)
CO2: 21 mmol/L — ABNORMAL LOW (ref 22–32)
Calcium: 7.8 mg/dL — ABNORMAL LOW (ref 8.9–10.3)
Chloride: 107 mmol/L (ref 98–111)
Creatinine, Ser: 2.26 mg/dL — ABNORMAL HIGH (ref 0.61–1.24)
GFR, Estimated: 33 mL/min — ABNORMAL LOW (ref >60)
Glucose, Bld: 98 mg/dL (ref 70–99)
Potassium: 3.7 mmol/L (ref 3.5–5.1)
Sodium: 137 mmol/L (ref 135–145)

## 2023-09-08 LAB — TROPONIN I (HIGH SENSITIVITY): Troponin I (High Sensitivity): 5 ng/L (ref <18)

## 2023-09-08 NOTE — ED Triage Notes (Signed)
 Pt arrive by [facility] from home for c/o sudden cp that started today at 20:30 with SOB. Pt took 3 nitroglycerin  at home with no relief. 324 mg ASA aond 0.4 nitroglycerin  sl given by EMS prior to arrival. Pt is AO x4 on arrival NAD noticed.

## 2023-09-08 NOTE — ED Notes (Signed)
 The patient reports he is feeling better and he wants to leave. This RN informed patient of the risks of leaving AMA/without being seen which include worsening condition and/or death. Pt verbalized understanding. This RN also instructed the patient to return to the ER for any new or worsening symptoms. The patient verbalized understanding and was ambulatory with independent steady gait out of the ER.

## 2023-11-25 ENCOUNTER — Inpatient Hospital Stay (HOSPITAL_COMMUNITY): Payer: Self-pay

## 2023-11-25 ENCOUNTER — Other Ambulatory Visit: Payer: Self-pay

## 2023-11-25 ENCOUNTER — Emergency Department (HOSPITAL_COMMUNITY): Payer: Self-pay

## 2023-11-25 ENCOUNTER — Inpatient Hospital Stay (HOSPITAL_COMMUNITY)
Admission: EM | Admit: 2023-11-25 | Discharge: 2023-11-27 | DRG: 281 | Disposition: A | Discharge status: Home / Self Care | Payer: Self-pay | Attending: Internal Medicine | Admitting: Internal Medicine

## 2023-11-25 ENCOUNTER — Encounter (HOSPITAL_COMMUNITY): Payer: Self-pay | Admitting: *Deleted

## 2023-11-25 DIAGNOSIS — G8929 Other chronic pain: Secondary | ICD-10-CM | POA: Diagnosis present

## 2023-11-25 DIAGNOSIS — Z880 Allergy status to penicillin: Secondary | ICD-10-CM

## 2023-11-25 DIAGNOSIS — E119 Type 2 diabetes mellitus without complications: Secondary | ICD-10-CM | POA: Diagnosis present

## 2023-11-25 DIAGNOSIS — Z955 Presence of coronary angioplasty implant and graft: Secondary | ICD-10-CM

## 2023-11-25 DIAGNOSIS — G40909 Epilepsy, unspecified, not intractable, without status epilepticus: Secondary | ICD-10-CM | POA: Diagnosis present

## 2023-11-25 DIAGNOSIS — M545 Low back pain, unspecified: Secondary | ICD-10-CM | POA: Diagnosis present

## 2023-11-25 DIAGNOSIS — Z8673 Personal history of transient ischemic attack (TIA), and cerebral infarction without residual deficits: Secondary | ICD-10-CM

## 2023-11-25 DIAGNOSIS — F17210 Nicotine dependence, cigarettes, uncomplicated: Secondary | ICD-10-CM | POA: Diagnosis present

## 2023-11-25 DIAGNOSIS — I214 Non-ST elevation (NSTEMI) myocardial infarction: Principal | ICD-10-CM | POA: Diagnosis present

## 2023-11-25 DIAGNOSIS — Z7982 Long term (current) use of aspirin: Secondary | ICD-10-CM

## 2023-11-25 DIAGNOSIS — I252 Old myocardial infarction: Secondary | ICD-10-CM

## 2023-11-25 DIAGNOSIS — K551 Chronic vascular disorders of intestine: Secondary | ICD-10-CM | POA: Diagnosis present

## 2023-11-25 DIAGNOSIS — Z85528 Personal history of other malignant neoplasm of kidney: Secondary | ICD-10-CM

## 2023-11-25 DIAGNOSIS — Z5900 Homelessness unspecified: Secondary | ICD-10-CM

## 2023-11-25 DIAGNOSIS — W19XXXA Unspecified fall, initial encounter: Secondary | ICD-10-CM | POA: Diagnosis present

## 2023-11-25 DIAGNOSIS — I10 Essential (primary) hypertension: Secondary | ICD-10-CM | POA: Diagnosis present

## 2023-11-25 DIAGNOSIS — F1010 Alcohol abuse, uncomplicated: Secondary | ICD-10-CM | POA: Diagnosis present

## 2023-11-25 DIAGNOSIS — F319 Bipolar disorder, unspecified: Secondary | ICD-10-CM | POA: Diagnosis present

## 2023-11-25 DIAGNOSIS — R569 Unspecified convulsions: Secondary | ICD-10-CM

## 2023-11-25 DIAGNOSIS — Z7902 Long term (current) use of antithrombotics/antiplatelets: Secondary | ICD-10-CM

## 2023-11-25 DIAGNOSIS — R079 Chest pain, unspecified: Secondary | ICD-10-CM | POA: Diagnosis present

## 2023-11-25 DIAGNOSIS — F209 Schizophrenia, unspecified: Secondary | ICD-10-CM | POA: Diagnosis present

## 2023-11-25 DIAGNOSIS — I2510 Atherosclerotic heart disease of native coronary artery without angina pectoris: Secondary | ICD-10-CM | POA: Diagnosis present

## 2023-11-25 DIAGNOSIS — Z8249 Family history of ischemic heart disease and other diseases of the circulatory system: Secondary | ICD-10-CM

## 2023-11-25 DIAGNOSIS — F1410 Cocaine abuse, uncomplicated: Secondary | ICD-10-CM | POA: Diagnosis present

## 2023-11-25 DIAGNOSIS — Z79899 Other long term (current) drug therapy: Secondary | ICD-10-CM

## 2023-11-25 DIAGNOSIS — Z91148 Patient's other noncompliance with medication regimen for other reason: Secondary | ICD-10-CM

## 2023-11-25 DIAGNOSIS — E785 Hyperlipidemia, unspecified: Secondary | ICD-10-CM | POA: Diagnosis present

## 2023-11-25 LAB — CBC
HCT: 44.6 % (ref 39.0–52.0)
Hemoglobin: 15.4 g/dL (ref 13.0–17.0)
MCH: 31.1 pg (ref 26.0–34.0)
MCHC: 34.5 g/dL (ref 30.0–36.0)
MCV: 90.1 fL (ref 80.0–100.0)
Platelets: 159 K/uL (ref 150–400)
RBC: 4.95 MIL/uL (ref 4.22–5.81)
RDW: 13 % (ref 11.5–15.5)
WBC: 5.5 K/uL (ref 4.0–10.5)
nRBC: 0 % (ref 0.0–0.2)

## 2023-11-25 LAB — BASIC METABOLIC PANEL WITH GFR
Anion gap: 15 (ref 5–15)
BUN: 10 mg/dL (ref 6–20)
CO2: 20 mmol/L — ABNORMAL LOW (ref 22–32)
Calcium: 9.9 mg/dL (ref 8.9–10.3)
Chloride: 102 mmol/L (ref 98–111)
Creatinine, Ser: 1.22 mg/dL (ref 0.61–1.24)
GFR, Estimated: >60 mL/min (ref >60)
Glucose, Bld: 99 mg/dL (ref 70–99)
Potassium: 4.1 mmol/L (ref 3.5–5.1)
Sodium: 137 mmol/L (ref 135–145)

## 2023-11-25 LAB — I-STAT CHEM 8, ED
BUN: 11 mg/dL (ref 6–20)
Calcium, Ion: 1.13 mmol/L — ABNORMAL LOW (ref 1.15–1.40)
Chloride: 105 mmol/L (ref 98–111)
Creatinine, Ser: 1.3 mg/dL — ABNORMAL HIGH (ref 0.61–1.24)
Glucose, Bld: 94 mg/dL (ref 70–99)
HCT: 46 % (ref 39.0–52.0)
Hemoglobin: 15.6 g/dL (ref 13.0–17.0)
Potassium: 4.1 mmol/L (ref 3.5–5.1)
Sodium: 139 mmol/L (ref 135–145)
TCO2: 19 mmol/L — ABNORMAL LOW (ref 22–32)

## 2023-11-25 LAB — ECHOCARDIOGRAM COMPLETE
AR max vel: 3.47 cm2
AV Area VTI: 3.71 cm2
AV Area mean vel: 3.65 cm2
AV Mean grad: 2.9 mmHg
AV Peak grad: 6.6 mmHg
Ao pk vel: 1.28 m/s
Area-P 1/2: 3.72 cm2
Calc EF: 56.1 %
Height: 70 in
S' Lateral: 3.01 cm
Single Plane A2C EF: 56.1 %
Single Plane A4C EF: 54.8 %
Weight: 2574.97 [oz_av]

## 2023-11-25 LAB — HEPARIN LEVEL (UNFRACTIONATED)
Heparin Unfractionated: 0.48 [IU]/mL (ref 0.30–0.70)
Heparin Unfractionated: 0.33 [IU]/mL (ref 0.30–0.70)

## 2023-11-25 LAB — LIPASE, BLOOD: Lipase: 22 U/L (ref 11–51)

## 2023-11-25 LAB — TROPONIN I (HIGH SENSITIVITY)
Troponin I (High Sensitivity): 779 ng/L (ref <18)
Troponin I (High Sensitivity): 1372 ng/L (ref <18)
Troponin I (High Sensitivity): 5199 ng/L (ref <18)
Troponin I (High Sensitivity): 7295 ng/L (ref <18)

## 2023-11-25 LAB — HIV ANTIBODY (ROUTINE TESTING W REFLEX): HIV Screen 4th Generation wRfx: NONREACTIVE

## 2023-11-25 MED ORDER — SODIUM CHLORIDE 0.9 % IV SOLN: INTRAVENOUS | Status: AC

## 2023-11-25 MED ORDER — CLOPIDOGREL BISULFATE 75 MG PO TABS
600.0000 mg | ORAL_TABLET | Freq: Once | ORAL | Status: AC
  Administered 2023-11-25: 600 mg via ORAL
  Filled 2023-11-25: qty 8

## 2023-11-25 MED ORDER — HEPARIN BOLUS VIA INFUSION
4000.0000 [IU] | Freq: Once | INTRAVENOUS | Status: AC
  Administered 2023-11-25: 4000 [IU] via INTRAVENOUS
  Filled 2023-11-25: qty 4000

## 2023-11-25 MED ORDER — CLOPIDOGREL BISULFATE 75 MG PO TABS
75.0000 mg | ORAL_TABLET | Freq: Every day | ORAL | Status: DC
  Administered 2023-11-26 – 2023-11-27 (×2): 75 mg via ORAL
  Filled 2023-11-25 (×3): qty 1

## 2023-11-25 MED ORDER — HEPARIN (PORCINE) 25000 UT/250ML-% IV SOLN
1000.0000 [IU]/h | INTRAVENOUS | Status: DC
  Administered 2023-11-25 – 2023-11-27 (×3): 1000 [IU]/h via INTRAVENOUS
  Filled 2023-11-25 (×3): qty 250

## 2023-11-25 MED ORDER — ONDANSETRON HCL 4 MG/2ML IJ SOLN: 4.0000 mg | Freq: Four times a day (QID) | INTRAMUSCULAR | Status: DC | PRN

## 2023-11-25 MED ORDER — AMLODIPINE BESYLATE 10 MG PO TABS
10.0000 mg | ORAL_TABLET | Freq: Every day | ORAL | Status: DC
  Administered 2023-11-26 – 2023-11-27 (×2): 10 mg via ORAL
  Filled 2023-11-25 (×2): qty 1

## 2023-11-25 MED ORDER — NITROGLYCERIN IN D5W 200-5 MCG/ML-% IV SOLN
0.0000 ug/min | INTRAVENOUS | Status: DC
  Administered 2023-11-25: 5 ug/min via INTRAVENOUS
  Filled 2023-11-25: qty 250

## 2023-11-25 MED ORDER — ASPIRIN 81 MG PO TBEC
81.0000 mg | DELAYED_RELEASE_TABLET | Freq: Every day | ORAL | Status: DC
  Administered 2023-11-26 – 2023-11-27 (×2): 81 mg via ORAL
  Filled 2023-11-25 (×3): qty 1

## 2023-11-25 MED ORDER — METOPROLOL TARTRATE 25 MG PO TABS: 25.0000 mg | ORAL_TABLET | Freq: Two times a day (BID) | ORAL | Status: DC

## 2023-11-25 MED ORDER — IOHEXOL 350 MG/ML SOLN
100.0000 mL | Freq: Once | INTRAVENOUS | Status: AC | PRN
  Administered 2023-11-25: 100 mL via INTRAVENOUS

## 2023-11-25 MED ORDER — ATORVASTATIN CALCIUM 80 MG PO TABS
80.0000 mg | ORAL_TABLET | Freq: Every day | ORAL | Status: DC
  Administered 2023-11-25 – 2023-11-27 (×3): 80 mg via ORAL
  Filled 2023-11-25: qty 2
  Filled 2023-11-25 (×2): qty 1

## 2023-11-25 MED ORDER — ACETAMINOPHEN 325 MG PO TABS
650.0000 mg | ORAL_TABLET | ORAL | Status: DC | PRN
  Administered 2023-11-26: 650 mg via ORAL
  Filled 2023-11-25: qty 2

## 2023-11-25 MED ORDER — ASPIRIN 81 MG PO CHEW
324.0000 mg | CHEWABLE_TABLET | Freq: Once | ORAL | Status: AC
  Administered 2023-11-25: 324 mg via ORAL
  Filled 2023-11-25: qty 4

## 2023-11-25 MED ORDER — LOSARTAN POTASSIUM 25 MG PO TABS
25.0000 mg | ORAL_TABLET | Freq: Every day | ORAL | Status: DC
  Administered 2023-11-25: 25 mg via ORAL
  Filled 2023-11-25: qty 1

## 2023-11-25 MED FILL — Amlodipine Besylate Tab 5 MG (Base Equivalent): 10.0000 mg | ORAL | Qty: 2 | Status: AC
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
## 2023-11-25 NOTE — ED Triage Notes (Signed)
 Pt arrived with [facility] for chest and back pain onset tonight. States he was walking home, started having a panic attack then increased chest pain. Pt very restless with tremors that he reports in baseline. Cardiologist is at [facility]. EMS gave nitroglycerin  and ASA enroute. Pain improved medication. Hx of multiple strokes and MIs

## 2023-11-25 NOTE — ED Notes (Signed)
 Blood pressure cuff not on patient at time of medication administration. Pt states he took it off because it was bothering him while he was trying to sleep. Pt educated on importance of keeping blood pressure cuff on in relation to monitoring with medications. Pt verbalized understanding.
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
## 2023-11-25 NOTE — ED Provider Triage Note (Signed)
 Emergency Medicine Provider Triage Evaluation Note  Stephen [name] , a 58 y.o. male  was evaluated in triage.  Pt complains of chest pains similar to previous MI.  States his back went out while he was walking home from a friend's house and developed severe chest pain similar to previous MI associate with shortness of breath and nausea.  Feels similar to previous MI and reports he has a history of 9 stents.  Has chronic back pain and this feels similar..  Review of Systems  Positive: Chest pain, back pain Negative: Bowel or bladder incontinence, new weakness or numbness.  Physical Exam  BP (!) 137/115   Pulse 77   Temp 98.2 F (36.8 C)   Resp (!) 22   SpO2 100%  Gen:   Awake, uncomfortable and restless. Resp:  Normal effort  MSK:   Moves extremities without difficulty  Other:    Medical Decision Making  Medically screening exam initiated at 1:54 AM.  Appropriate orders placed.  Stephen [name] was informed that the remainder of the evaluation will be completed by another provider, this initial triage assessment does not replace that evaluation, and the importance of remaining in the ED until their evaluation is complete.  Appears uncomfortable writhing around the wheelchair.  EKG with diffuse J-point elevation similar to previous.   Carita Senior, MD 11/25/23 [phone]
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
## 2023-11-25 NOTE — ED Notes (Signed)
 Pt denies chest pain/shortness of breath at this time.
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
## 2023-11-25 NOTE — ED Provider Notes (Signed)
 [place] EMERGENCY DEPARTMENT AT [facility] Provider Note   CSN: [id number] Arrival date & time: 11/25/23  0139     Patient presents with: Chest Pain   Stephen [name] is a 58 y.o. male.   Patient with history of hypertension, diabetes, CAD with multiple stents, schizophrenia, GERD, bipolar disorder started while walking home from a friend's house earlier tonight.  Pain in the center of his chest without radiation.  Associate with shortness of breath and nausea without vomiting.  No diaphoresis.  Feels similar to previous MI.  Reports back went out prior to his chest hurting.  Does have a history of chronic back pain and this feels similar.  No new weakness, numbness or tingling.  No bowel or bladder incontinence.  States he has a history of multiple stents and strokes. Has restlessness and tremors at baseline per him.  He is grinding his teeth which is baseline for him.  Minimal relief with nitroglycerin .  The history is provided by the patient.  Chest Pain Associated symptoms: back pain, nausea and shortness of breath   Associated symptoms: no abdominal pain, no dizziness, no fever, no headache, no vomiting and no weakness        Prior to Admission medications   Medication Sig Start Date End Date Taking? Authorizing Provider  acetaminophen  (TYLENOL ) 325 MG tablet Take 2 tablets (650 mg total) by mouth every 6 (six) hours as needed for mild pain or headache. As needed for ribcage pain Patient not taking: Reported on 06/09/2019 04/15/17   Briana Joya BIRCH, MD  atorvastatin  (LIPITOR) 40 MG tablet Take 1 tablet (40 mg total) by mouth daily at 6 PM. Patient not taking: Reported on 06/09/2019 04/15/17   Nettey, Shayla D, MD  isosorbide  mononitrate (IMDUR ) 60 MG 24 hr tablet Take 1 tablet (60 mg total) by mouth daily. Patient not taking: Reported on 06/09/2019 05/02/17   Vann, Jessica U, DO  levETIRAcetam  (KEPPRA ) 500 MG tablet Take 1 tablet (500 mg total) by mouth 2 (two) times  daily. Patient not taking: Reported on 06/09/2019 05/01/17   Vann, Jessica U, DO  pantoprazole  (PROTONIX ) 40 MG tablet Take 1 tablet (40 mg total) by mouth daily before breakfast. Patient not taking: Reported on 06/09/2019 04/15/17   Briana Joya D, MD    Allergies: Penicillins    Review of Systems  Constitutional:  Negative for activity change, appetite change and fever.  HENT:  Negative for congestion and rhinorrhea.   Respiratory:  Positive for chest tightness and shortness of breath.   Cardiovascular:  Positive for chest pain.  Gastrointestinal:  Positive for nausea. Negative for abdominal pain and vomiting.  Genitourinary:  Negative for dysuria and hematuria.  Musculoskeletal:  Positive for back pain.  Skin:  Negative for rash.  Neurological:  Negative for dizziness, weakness and headaches.   all other systems are negative except as noted in the HPI and PMH.    Updated Vital Signs BP (!) 137/115   Pulse 77   Temp 98.2 F (36.8 C)   Resp (!) 22   SpO2 100%   Physical Exam Vitals and nursing note reviewed.  Constitutional:      General: He is not in acute distress.    Appearance: He is well-developed.     Comments: Restless, grinding teeth  HENT:     Head: Normocephalic and atraumatic.     Mouth/Throat:     Pharynx: No oropharyngeal exudate.  Eyes:     Conjunctiva/sclera: Conjunctivae normal.  Pupils: Pupils are equal, round, and reactive to light.  Neck:     Comments: No meningismus. Cardiovascular:     Rate and Rhythm: Normal rate and regular rhythm.     Heart sounds: Normal heart sounds. No murmur heard. Pulmonary:     Effort: Pulmonary effort is normal. No respiratory distress.     Breath sounds: Normal breath sounds.  Abdominal:     Palpations: Abdomen is soft.     Tenderness: There is no abdominal tenderness. There is no guarding or rebound.  Musculoskeletal:        General: No tenderness. Normal range of motion.     Cervical back: Normal range of motion  and neck supple.     Comments: Equal radial, DP pulses bilaterally 5/5 strength in lower extremities.  Ankle flexion and extension intact  Skin:    General: Skin is warm.  Neurological:     Mental Status: He is alert and oriented to person, place, and time.     Cranial Nerves: No cranial nerve deficit.     Motor: No abnormal muscle tone.     Coordination: Coordination normal.     Comments:  5/5 strength throughout. CN 2-12 intact.Equal grip strength.   Psychiatric:        Behavior: Behavior normal.     (all labs ordered are listed, but only abnormal results are displayed) Labs Reviewed  BASIC METABOLIC PANEL WITH GFR - Abnormal; Notable for the following components:      Result Value   CO2 20 (*)    All other components within normal limits  I-STAT CHEM 8, ED - Abnormal; Notable for the following components:   Creatinine, Ser 1.30 (*)    Calcium , Ion 1.13 (*)    TCO2 19 (*)    All other components within normal limits  TROPONIN I (HIGH SENSITIVITY) - Abnormal; Notable for the following components:   Troponin I (High Sensitivity) 779 (*)    All other components within normal limits  TROPONIN I (HIGH SENSITIVITY) - Abnormal; Notable for the following components:   Troponin I (High Sensitivity) 1,372 (*)    All other components within normal limits  CBC  LIPASE, BLOOD  HEPARIN  LEVEL (UNFRACTIONATED)    EKG: EKG Interpretation Date/Time:  Saturday November 25 2023 01:40:05 EDT Ventricular Rate:  75 PR Interval:  120 QRS Duration:  90 QT Interval:  411 QTC Calculation: 460 R Axis:   95  Text Interpretation: Right and left arm electrode reversal, interpretation assumes no reversal Sinus rhythm LVH with secondary repolarization abnormality Probable lateral infarct, age indeterminate Borderline ST elevation, inferior leads Artifact in lead(s) I II III aVR aVL aVF V1 V2 V4 V5 V6 diffuse J point elevation similar to previous Confirmed by Carita Senior [phone]) on 11/25/2023  1:44:03 AM  Radiology: [facility] Chest Portable 1 View Result Date: 11/25/2023 EXAM: 1 VIEW(S) XRAY OF THE CHEST 11/25/2023 02:59:00 AM COMPARISON: PA and lateral chest 09/08/2023. CLINICAL HISTORY: cp. cp cp. cp FINDINGS: LUNGS AND PLEURA: No focal pulmonary opacity. No pulmonary edema. No pleural effusion. No pneumothorax. HEART AND MEDIASTINUM: No acute abnormality of the cardiac and mediastinal silhouettes. A stent is again noted in the lad coronary artery. BONES AND SOFT TISSUES: No acute osseous abnormality. IMPRESSION: 1. No acute cardiopulmonary process. Stable chest. Electronically signed by: Francis Quam MD 11/25/2023 04:41 AM EDT RP Workstation: [id number]   CT Angio Chest/Abd/Pel for Dissection W and/or Wo Contrast Result Date: 11/25/2023 EXAM: CTA CHEST, ABDOMEN AND PELVIS WITH AND  WITHOUT CONTRAST 11/25/2023 02:36:00 AM TECHNIQUE: Noncontrast CT of the chest was initially obtained. CTA of the chest was performed with the administration of 100 mL of intravenous iohexol (OMNIPAQUE) 350 MG/ML injection. CTA of the abdomen and pelvis was performed with administration of 100 mL of intravenous iohexol (OMNIPAQUE) 350 MG/ML injection. Multiplanar reformatted images are provided for review. MIP images are provided for review. Automated exposure control, iterative reconstruction, and/or weight based adjustment of the mA/kV was utilized to reduce the radiation dose to as low as reasonably achievable. COMPARISON: CT abdomen and pelvis without contrast 05/31/2017, CT abdomen and pelvis with contrast 01/29/2017. No prior chest CT or CTA. CLINICAL HISTORY: Acute aortic syndrome (AAS) suspected. Chief complaints; Chest Pain; CT Angio Chest/Abd/Pel for Dissection W and/or Wo Contrast; Acute aortic syndrome (AAS) suspected; 100 OMNI350 GIVEN IN 2oG IV RAC; Without Incident. FINDINGS: VASCULATURE: AORTA: The thoracic aorta and great vessels are normal in course and caliber. There is a small amount of scattered  calcification in the descending aorta and minimal calcification in the great vessels. . In the abdominal aorta, there are moderate patchy calcific plaques primarily in the infrarenal segment. There is no thoracic or abdominal aortic aneurysm, stenosis, or dissection. PULMONARY ARTERIES: The pulmonary arteries are well opacified, normal caliber, and do not show evidence of emboli to the segmental level. The pulmonary veins are nondilated. SABRA GREAT VESSELS OF AORTIC ARCH: Normal course and caliber. Minimal calcification. No dissection. No arterial occlusion or significant stenosis. CELIAC TRUNK: No acute finding. No occlusion or significant stenosis. SUPERIOR MESENTERIC ARTERY: No acute finding. No occlusion or significant stenosis. 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 RENAL ARTERIES: Both renal arteries are patent. There are scattered calcific plaques in some of the bilateral renal artery hilar branches. No occlusion or significant stenosis of the main renal arteries. ILIAC ARTERIES: The iliac arteries demonstrate patchy calcific plaques but no flow limiting stenosis. CHEST: MEDIASTINUM: No mediastinal lymphadenopathy. The cardiac size is normal. There is no pericardial effusion. There is a stent in the mid to distal LAD coronary artery, and scattered calcific plaque in the right and circumflex coronary arteries. LUNGS AND PLEURA: The lungs are clear of infiltrates. Above the plane of both prior abdomen and pelvis CTs, there is a rounded 6 mm solid nodule on series 8, axial image 62 in the right middle lobe. There is a chronic linear scarlike opacity in the lingular base. Right lower lobe paraspinal scarring also noted adjacent to osteophytes of the thoracic spine. There is no consolidation, effusion or pneumothorax. THORACIC BONES AND SOFT TISSUES: Spondylosis of the thoracic spine. No acute bone or soft  tissue abnormality. ABDOMEN AND PELVIS: LIVER: The liver is mildly steatotic, including with focal periligamentous fat in segment 4b. There is no mass enhancement. GALLBLADDER AND BILE DUCTS: The gallbladder has been removed since the last CT. There is no biliary dilatation. SPLEEN: The spleen is unremarkable. PANCREAS: The pancreas is unremarkable. ADRENAL GLANDS: Bilateral adrenal glands demonstrate no acute abnormality. KIDNEYS, URETERS AND BLADDER: There previously was a heterogeneous mass in the inferior pole of the right kidney which has been resected in the interval. There is a Bosniak 1 cyst in the mid pole of this kidney, measuring 1.1 cm and 13 Hounsfield units requiring no follow-up. No renal mass enhancement is seen. No urinary stone or obstruction. The bladder is unremarkable. GI AND BOWEL: Stomach and duodenal sweep demonstrate no acute abnormality. There is  no bowel obstruction. No abnormal bowel wall thickening or distension. REPRODUCTIVE: Reproductive organs are unremarkable. PERITONEUM AND RETROPERITONEUM: No ascites or free air. LYMPH NODES: No lymphadenopathy. ABDOMINAL BONES AND SOFT TISSUES: Spondylosis of the lumbar spine. There is a small umbilical hernia containing fat. . There is no incarcerated hernia. No acute or other significant osseous findings. No chest or abdominal wall masses. IMPRESSION: 1. No evidence of acute aortic syndrome, including dissection or aneurysm. 2. No evidence of pulmonary embolism. 3. 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 4. Moderate patchy calcific plaques in the infrarenal abdominal aorta without aneurysm or stenosis, and patchy calcific plaques in the iliac arteries without flow-limiting stenosis. SABRA 5. 6 mm solitary right middle lobe nodule. . Chest CT is recommended at 6 to 12 months; if stable, then follow-up repeat CT 18 to 24 months from today since he has a history of renal cell  carcinoma. 6. Coronary calcifications with prior lad stenting. Electronically signed by: Francis Quam MD 11/25/2023 04:39 AM EDT RP Workstation: [id number]     .Critical Care  Performed by: Carita Senior, MD Authorized by: Carita Senior, MD   Critical care provider statement:    Critical care time (minutes):  45   Critical care time was exclusive of:  Separately billable procedures and treating other patients   Critical care was necessary to treat or prevent imminent or life-threatening deterioration of the following conditions: NSTEMI.   Critical care was time spent personally by me on the following activities:  Development of treatment plan with patient or surrogate, discussions with consultants, evaluation of patient's response to treatment, examination of patient, ordering and review of laboratory studies, ordering and review of radiographic studies, ordering and performing treatments and interventions, pulse oximetry, re-evaluation of patient's condition, review of old charts, blood draw for specimens and obtaining history from patient or surrogate   I assumed direction of critical care for this patient from another provider in my specialty: no     Care discussed with: admitting provider      Medications Ordered in the ED  aspirin  chewable tablet 324 mg (has no administration in time range)  iohexol (OMNIPAQUE) 350 MG/ML injection 100 mL (100 mLs Intravenous Contrast Given 11/25/23 0235)                                    Medical Decision Making Amount and/or Complexity of Data Reviewed Independent Historian: EMS Labs: ordered. Decision-making details documented in ED Course. Radiology: ordered and independent interpretation performed. Decision-making details documented in ED Course. ECG/medicine tests: ordered and independent interpretation performed. Decision-making details documented in ED Course.  Risk OTC drugs. Prescription drug management. Decision regarding  hospitalization.   Central chest pain rating to the back.  Patient very uncomfortable writhing around the bed therefore aortic dissection is considered.  EKG without acute ischemia but does show similar J-point elevation diffusely.  Received aspirin  and nitroglycerin  from EMS.  CT angiogram was pursued which is negative for aortic dissection or aneurysm.  Troponin elevated 779  Per cardiology records: he has known CAD with multiple PCIs, most recently found to have subtotal occlusion of the mid RCA, attempted to undergo PCI to this lesion in October 2022 which resulted in possible root dissection/perforation. Repeat attempt at [facility] in 03/2021 also unsuccessful. The vessel was 100% occluded in the mid segment post procedure. He appeared to not have any ill  effects as a result of this procedure. Had NSTEMi in 02/2022 at [facility] of LAD/Diagonal artery.  D/w cardiology Dr. Cesario.  Agree with IV heparin  and nitroglycerin . Recommend hospitalist admission and they will consult.  CTA negative for aneurysm or dissection.  Does show IMA stenosis with preserved distal opacification.  Does have aortic atherosclerosis and iliac artery sclerosis as well. Lung nodule needs follow-up.  Patient's chest pain is improving with IV heparin  and nitroglycerin . No evidence of acute aortic syndrome or aneurysm.  Medical admission discussed with Dr. Alfornia.      Final diagnoses:  NSTEMI (non-ST elevated myocardial infarction) [facility])    ED Discharge Orders     None          Carita Senior, MD 11/25/23 [phone]

## 2023-11-25 NOTE — Hospital Course (Signed)
 58 y.o. male with history of CAD s/p MI (known CTO of RCA, PCI to LAD '22), CVA, DM, HTN, bipolar, seizures, schizophrenia, cocaine use, alcohol use, tobacco use presenting with chest pain. Found to have NSTEMI.  Assessment and Plan:   Atypical chest pain/NSTEMI - Retrosternal chest pain no ST changes on ECG but noted elevations in high-sensitivity troponin suggestive of NSTEMI.  Cardiology consulted and following closely.  Will initiate aspirin  drip, statin.  Not on a beta-blocker secondary to recent cocaine use.  Possibility of [facility] later today or Monday.   CAD with PCI -CAD with multiple PCI's and was found to have subtotal occlusion of mid RCA , PCI 11/2020 which resulted in possible root dissection/perforation.  A repeat attempt at [facility] 03/2021 was also unsuccessful.  NSTEMI 03/2022 at [facility] with successful balloon angioplasty of D2 and kissing balloon of mid LAD with IVUS guided PCI of mid LAD overlapping old stent.   Uncontrolled hypertension - Unclear if patient had any meds.  Beta-blocker on board losartan, amlodipine on board as well as IV nitro.   Hyperlipidemia - Atorvastatin  80 mg daily on board.   Polysubstance use - Patient was taking cocaine a couple days ago.  Encourage cessation.  Holding off on beta-blockers   Medication noncompliance - Patient is homeless, has not been on any cardiac medications.  Will likely need to restart and represcribe any cardiac regimen upon discharge.   Polar disorder/schizophrenia/seizure disorder - Patient has not been on any medications recently, not restarting any medication.   Goals of care - Patient is homeless.  Will work with case management, patient, family on disposition planning after cardiac cath.

## 2023-11-25 NOTE — Progress Notes (Signed)
  Echocardiogram 2D Echocardiogram has been performed.  Devora Ellouise SAUNDERS 11/25/2023, 3:51 PM

## 2023-11-26 DIAGNOSIS — I200 Unstable angina: Secondary | ICD-10-CM

## 2023-11-26 LAB — COMPREHENSIVE METABOLIC PANEL WITH GFR
ALT: 24 U/L (ref 0–44)
AST: 77 U/L — ABNORMAL HIGH (ref 15–41)
Albumin: 2.9 g/dL — ABNORMAL LOW (ref 3.5–5.0)
Alkaline Phosphatase: 63 U/L (ref 38–126)
Anion gap: 13 (ref 5–15)
BUN: 8 mg/dL (ref 6–20)
CO2: 20 mmol/L — ABNORMAL LOW (ref 22–32)
Calcium: 8.5 mg/dL — ABNORMAL LOW (ref 8.9–10.3)
Chloride: 105 mmol/L (ref 98–111)
Creatinine, Ser: 1.14 mg/dL (ref 0.61–1.24)
GFR, Estimated: >60 mL/min (ref >60)
Glucose, Bld: 119 mg/dL — ABNORMAL HIGH (ref 70–99)
Potassium: 3.7 mmol/L (ref 3.5–5.1)
Sodium: 138 mmol/L (ref 135–145)
Total Bilirubin: 0.5 mg/dL (ref 0.0–1.2)
Total Protein: 6 g/dL — ABNORMAL LOW (ref 6.5–8.1)

## 2023-11-26 LAB — CK: Total CK: 670 U/L — ABNORMAL HIGH (ref 49–397)

## 2023-11-26 LAB — MAGNESIUM: Magnesium: 1.9 mg/dL (ref 1.7–2.4)

## 2023-11-26 LAB — LIPID PANEL
Cholesterol: 132 mg/dL (ref 0–200)
HDL: 52 mg/dL (ref >40)
LDL Cholesterol: 63 mg/dL (ref 0–99)
Total CHOL/HDL Ratio: 2.5 ratio
Triglycerides: 84 mg/dL (ref <150)
VLDL: 17 mg/dL (ref 0–40)

## 2023-11-26 LAB — CBC
HCT: 45.3 % (ref 39.0–52.0)
Hemoglobin: 15.2 g/dL (ref 13.0–17.0)
MCH: 31.7 pg (ref 26.0–34.0)
MCHC: 33.6 g/dL (ref 30.0–36.0)
MCV: 94.4 fL (ref 80.0–100.0)
Platelets: 128 K/uL — ABNORMAL LOW (ref 150–400)
RBC: 4.8 MIL/uL (ref 4.22–5.81)
RDW: 13.2 % (ref 11.5–15.5)
WBC: 5 K/uL (ref 4.0–10.5)
nRBC: 0 % (ref 0.0–0.2)

## 2023-11-26 LAB — HEPARIN LEVEL (UNFRACTIONATED): Heparin Unfractionated: 0.37 [IU]/mL (ref 0.30–0.70)

## 2023-11-26 MED ORDER — LOSARTAN POTASSIUM 50 MG PO TABS
50.0000 mg | ORAL_TABLET | Freq: Every day | ORAL | Status: DC
  Administered 2023-11-26 – 2023-11-27 (×2): 50 mg via ORAL
  Filled 2023-11-26 (×2): qty 1

## 2023-11-26 MED ORDER — FREE WATER
500.0000 mL | Freq: Once | Status: AC
  Administered 2023-11-27: 500 mL via ORAL

## 2023-11-26 MED ORDER — OXYCODONE HCL 5 MG PO TABS
5.0000 mg | ORAL_TABLET | ORAL | Status: DC | PRN
  Administered 2023-11-26 – 2023-11-27 (×2): 5 mg via ORAL
  Filled 2023-11-26 (×2): qty 1

## 2023-11-26 MED ORDER — LIDOCAINE 5 % EX PTCH
1.0000 | MEDICATED_PATCH | CUTANEOUS | Status: DC
  Administered 2023-11-26: 1 via TRANSDERMAL
  Filled 2023-11-26: qty 1
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
## 2023-11-26 NOTE — Progress Notes (Signed)
 Progress Note   Patient: Stephen [name] FMW:[id number] DOB: 12-02-1965 DOA: 11/25/2023  DOS: the patient was seen and examined on 11/26/2023   Brief hospital course:   58 y.o. male with history of CAD s/p MI (known CTO of RCA, PCI to LAD '22), CVA, DM, HTN, bipolar, seizures, schizophrenia, cocaine use, alcohol use, tobacco use presenting with chest pain. Found to have NSTEMI.  Assessment and Plan:   Atypical chest pain/NSTEMI - Retrosternal chest pain no ST changes on ECG but noted elevations in high-sensitivity troponin suggestive of NSTEMI.  Cardiology consulted and following closely.  Aspirin , hep drip, statin.  Not on a beta-blocker secondary to recent cocaine use.  Possibility of LCH on Monday.   CAD with PCI -CAD with multiple PCI's and was found to have subtotal occlusion of mid RCA , PCI 11/2020 which resulted in possible root dissection/perforation.  A repeat attempt at [facility] 03/2021 was also unsuccessful.  NSTEMI 03/2022 at [facility] with successful balloon angioplasty of D2 and kissing balloon of mid LAD with IVUS guided PCI of mid LAD overlapping old stent.   Uncontrolled hypertension - Unclear if patient had any meds.  losartan, amlodipine on board as well as IV nitro.   Hyperlipidemia - Atorvastatin  80 mg daily on board.   Polysubstance use - Patient was taking cocaine a couple days ago.  Encourage cessation.  Holding off on beta-blockers   Medication noncompliance - Patient is homeless, has not been on any cardiac medications.  Will likely need to restart and represcribe any cardiac regimen upon discharge.   Polar disorder/schizophrenia/seizure disorder - Patient has not been on any medications recently, not restarting any medication.   Goals of care - Patient is homeless.  Will work with case management, patient, family on disposition planning after cardiac cath.   Subjective: Patient resting comfortably.  Denies any fever, chest pain, shortness of breath, nausea,  vomiting, abdominal pain.  States he has back pain.  Follow-up  Physical Exam:  Vitals:   11/26/23 0411 11/26/23 0739 11/26/23 1015 11/26/23 1133  BP: (!) 178/90 (!) 167/84 (!) 167/84 (!) 178/93  Pulse: 60 68    Resp: 18 19  19   Temp: 98.7 F (37.1 C) 98.5 F (36.9 C)  98.5 F (36.9 C)  TempSrc: Oral Oral  Oral  SpO2: 96% 100%  98%  Weight:      Height:        GENERAL:  Alert, pleasant, no acute distress  HEENT:  EOMI CARDIOVASCULAR:  RRR, no murmurs appreciated RESPIRATORY:  Clear to auscultation, no wheezing, rales, or rhonchi GASTROINTESTINAL:  Soft, nontender, nondistended EXTREMITIES:  No LE edema bilaterally NEURO:  No new focal deficits appreciated SKIN:  No rashes noted PSYCH:  Appropriate mood and affect    Data Reviewed:  Imaging Studies: ECHOCARDIOGRAM COMPLETE Result Date: 11/25/2023    ECHOCARDIOGRAM REPORT   Patient Name:   Stephen [name] Date of Exam: 11/25/2023 Medical Rec #:  [id number]       Height:       70.0 in Accession #:    [id number]      Weight:       160.9 lb Date of Birth:  [birth year]       BSA:          1.903 m Patient Age:    58 years        BP:           152/85 mmHg Patient Gender: M  HR:           62 bpm. Exam Location:  Inpatient Procedure: 2D Echo, Cardiac Doppler and Color Doppler (Both Spectral and Color            Flow Doppler were utilized during procedure). Indications:    122-I22.9 Subsequent ST elevation (STEM) and non-ST elevation                 (NSTEMI) myocardial infarction  History:        Patient has prior history of Echocardiogram examinations, most                 recent 04/03/2017. CAD, Previous Myocardial Infarction and Acute                 MI, Stroke, Signs/Symptoms:Chest Pain, Altered Mental Status and                 Syncope; Risk Factors:Dyslipidemia, Hypertension, Diabetes and                 Current Smoker. Cocaine abuse.  Sonographer:    Ellouise Mose RDCS Referring Phys: [id number] Vonte Rossin Stephen [name] IMPRESSIONS  1. Left  ventricular ejection fraction, by estimation, is 55 to 60%. The left ventricle has normal function. The left ventricle has no regional wall motion abnormalities. There is mild left ventricular hypertrophy. Left ventricular diastolic parameters were normal.  2. Right ventricular systolic function is normal. The right ventricular size is normal. Tricuspid regurgitation signal is inadequate for assessing PA pressure.  3. The mitral valve is abnormal. Mild mitral valve regurgitation. No evidence of mitral stenosis.  4. The aortic valve is tricuspid. Aortic valve regurgitation is not visualized. No aortic stenosis is present.  5. The inferior vena cava is normal in size with greater than 50% respiratory variability, suggesting right atrial pressure of 3 mmHg. FINDINGS  Left Ventricle: Left ventricular ejection fraction, by estimation, is 55 to 60%. The left ventricle has normal function. The left ventricle has no regional wall motion abnormalities. The left ventricular internal cavity size was normal in size. There is  mild left ventricular hypertrophy. Left ventricular diastolic parameters were normal. Right Ventricle: The right ventricular size is normal. Right vetricular wall thickness was not well visualized. Right ventricular systolic function is normal. Tricuspid regurgitation signal is inadequate for assessing PA pressure. Left Atrium: Left atrial size was normal in size. Right Atrium: Right atrial size was normal in size. Pericardium: There is no evidence of pericardial effusion. Mitral Valve: The mitral valve is abnormal. Mild mitral valve regurgitation. No evidence of mitral valve stenosis. Tricuspid Valve: The tricuspid valve is normal in structure. Tricuspid valve regurgitation is trivial. No evidence of tricuspid stenosis. Aortic Valve: The aortic valve is tricuspid. Aortic valve regurgitation is not visualized. No aortic stenosis is present. Aortic valve mean gradient measures 2.9 mmHg. Aortic valve peak  gradient measures 6.6 mmHg. Aortic valve area, by VTI measures 3.71 cm. Pulmonic Valve: The pulmonic valve was not well visualized. Pulmonic valve regurgitation is trivial. No evidence of pulmonic stenosis. Aorta: The aortic root and ascending aorta are structurally normal, with no evidence of dilitation. Venous: The inferior vena cava is normal in size with greater than 50% respiratory variability, suggesting right atrial pressure of 3 mmHg. IAS/Shunts: No atrial level shunt detected by color flow Doppler.  LEFT VENTRICLE PLAX 2D LVIDd:         4.67 cm      Diastology LVIDs:  3.01 cm      LV e' medial:    10.10 cm/s LV PW:         1.10 cm      LV E/e' medial:  7.1 LV IVS:        1.23 cm      LV e' lateral:   11.60 cm/s LVOT diam:     2.43 cm      LV E/e' lateral: 6.1 LV SV:         96 LV SV Index:   50 LVOT Area:     4.64 cm LV IVRT:       134 msec  LV Volumes (MOD) LV vol d, MOD A2C: 110.0 ml LV vol d, MOD A4C: 88.4 ml LV vol s, MOD A2C: 48.3 ml LV vol s, MOD A4C: 40.0 ml LV SV MOD A2C:     61.7 ml LV SV MOD A4C:     88.4 ml LV SV MOD BP:      57.9 ml RIGHT VENTRICLE             IVC RV S prime:     14.70 cm/s  IVC diam: 1.45 cm TAPSE (M-mode): 2.0 cm LEFT ATRIUM             Index        RIGHT ATRIUM           Index LA diam:        3.37 cm 1.77 cm/m   RA Area:     14.20 cm LA Vol (A2C):   48.3 ml 25.38 ml/m  RA Volume:   32.40 ml  17.02 ml/m LA Vol (A4C):   48.4 ml 25.43 ml/m LA Biplane Vol: 50.8 ml 26.69 ml/m  AORTIC VALVE AV Area (Vmax):    3.47 cm AV Area (Vmean):   3.65 cm AV Area (VTI):     3.71 cm AV Vmax:           128.33 cm/s AV Vmean:          78.595 cm/s AV VTI:            0.257 m AV Peak Grad:      6.6 mmHg AV Mean Grad:      2.9 mmHg LVOT Vmax:         95.90 cm/s LVOT Vmean:        61.800 cm/s LVOT VTI:          0.206 m LVOT/AV VTI ratio: 0.80  AORTA Ao Root diam: 3.24 cm Ao Asc diam:  3.61 cm MITRAL VALVE MV Area (PHT): 3.72 cm    SHUNTS MV Decel Time: 204 msec    Systemic VTI:  0.21  m MV E velocity: 71.30 cm/s  Systemic Diam: 2.43 cm MV A velocity: 49.80 cm/s MV E/A ratio:  1.43 Dorn Ross MD Electronically signed by Dorn Ross MD Signature Date/Time: 11/25/2023/4:40:44 PM    Final    DG Chest Portable 1 View Result Date: 11/25/2023 EXAM: 1 VIEW(S) XRAY OF THE CHEST 11/25/2023 02:59:00 AM COMPARISON: PA and lateral chest 09/08/2023. CLINICAL HISTORY: cp. cp cp. cp FINDINGS: LUNGS AND PLEURA: No focal pulmonary opacity. No pulmonary edema. No pleural effusion. No pneumothorax. HEART AND MEDIASTINUM: No acute abnormality of the cardiac and mediastinal silhouettes. A stent is again noted in the lad coronary artery. BONES AND SOFT TISSUES: No acute osseous abnormality. IMPRESSION: 1. No acute cardiopulmonary process. Stable chest. Electronically signed by: Francis Quam MD 11/25/2023 04:41  AM EDT RP Workstation: [id number]   CT Angio Chest/Abd/Pel for Dissection W and/or Wo Contrast Result Date: 11/25/2023 EXAM: CTA CHEST, ABDOMEN AND PELVIS WITH AND WITHOUT CONTRAST 11/25/2023 02:36:00 AM TECHNIQUE: Noncontrast CT of the chest was initially obtained. CTA of the chest was performed with the administration of 100 mL of intravenous iohexol (OMNIPAQUE) 350 MG/ML injection. CTA of the abdomen and pelvis was performed with administration of 100 mL of intravenous iohexol (OMNIPAQUE) 350 MG/ML injection. Multiplanar reformatted images are provided for review. MIP images are provided for review. Automated exposure control, iterative reconstruction, and/or weight based adjustment of the mA/kV was utilized to reduce the radiation dose to as low as reasonably achievable. COMPARISON: CT abdomen and pelvis without contrast 05/31/2017, CT abdomen and pelvis with contrast 01/29/2017. No prior chest CT or CTA. CLINICAL HISTORY: Acute aortic syndrome (AAS) suspected. Chief complaints; Chest Pain; CT Angio Chest/Abd/Pel for Dissection W and/or Wo Contrast; Acute aortic syndrome (AAS) suspected; 100  [id number] GIVEN IN 2oG IV RAC; Without Incident. FINDINGS: VASCULATURE: AORTA: The thoracic aorta and great vessels are normal in course and caliber. There is a small amount of scattered calcification in the descending aorta and minimal calcification in the great vessels. . In the abdominal aorta, there are moderate patchy calcific plaques primarily in the infrarenal segment. There is no thoracic or abdominal aortic aneurysm, stenosis, or dissection. PULMONARY ARTERIES: The pulmonary arteries are well opacified, normal caliber, and do not show evidence of emboli to the segmental level. The pulmonary veins are nondilated. Stephen [name] GREAT VESSELS OF AORTIC ARCH: Normal course and caliber. Minimal calcification. No dissection. No arterial occlusion or significant stenosis. CELIAC TRUNK: No acute finding. No occlusion or significant stenosis. SUPERIOR MESENTERIC ARTERY: No acute finding. No occlusion or significant stenosis. 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 RENAL ARTERIES: Both renal arteries are patent. There are scattered calcific plaques in some of the bilateral renal artery hilar branches. No occlusion or significant stenosis of the main renal arteries. ILIAC ARTERIES: The iliac arteries demonstrate patchy calcific plaques but no flow limiting stenosis. CHEST: MEDIASTINUM: No mediastinal lymphadenopathy. The cardiac size is normal. There is no pericardial effusion. There is a stent in the mid to distal LAD coronary artery, and scattered calcific plaque in the right and circumflex coronary arteries. LUNGS AND PLEURA: The lungs are clear of infiltrates. Above the plane of both prior abdomen and pelvis CTs, there is a rounded 6 mm solid nodule on series 8, axial image 62 in the right middle lobe. There is a chronic linear scarlike opacity in the lingular base. Right lower lobe paraspinal scarring also noted adjacent  to osteophytes of the thoracic spine. There is no consolidation, effusion or pneumothorax. THORACIC BONES AND SOFT TISSUES: Spondylosis of the thoracic spine. No acute bone or soft tissue abnormality. ABDOMEN AND PELVIS: LIVER: The liver is mildly steatotic, including with focal periligamentous fat in segment 4b. There is no mass enhancement. GALLBLADDER AND BILE DUCTS: The gallbladder has been removed since the last CT. There is no biliary dilatation. SPLEEN: The spleen is unremarkable. PANCREAS: The pancreas is unremarkable. ADRENAL GLANDS: Bilateral adrenal glands demonstrate no acute abnormality. KIDNEYS, URETERS AND BLADDER: There previously was a heterogeneous mass in the inferior pole of the right kidney which has been resected in the interval. There is a Bosniak 1 cyst in the mid pole of this kidney, measuring 1.1 cm and 13 Hounsfield units requiring no follow-up. No  renal mass enhancement is seen. No urinary stone or obstruction. The bladder is unremarkable. GI AND BOWEL: Stomach and duodenal sweep demonstrate no acute abnormality. There is no bowel obstruction. No abnormal bowel wall thickening or distension. REPRODUCTIVE: Reproductive organs are unremarkable. PERITONEUM AND RETROPERITONEUM: No ascites or free air. LYMPH NODES: No lymphadenopathy. ABDOMINAL BONES AND SOFT TISSUES: Spondylosis of the lumbar spine. There is a small umbilical hernia containing fat. . There is no incarcerated hernia. No acute or other significant osseous findings. No chest or abdominal wall masses. IMPRESSION: 1. No evidence of acute aortic syndrome, including dissection or aneurysm. 2. No evidence of pulmonary embolism. 3. 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 4. Moderate patchy calcific plaques in the infrarenal abdominal aorta without aneurysm or stenosis, and patchy calcific plaques in the iliac arteries without flow-limiting stenosis. Stephen [name] 5. 6 mm  solitary right middle lobe nodule. . Chest CT is recommended at 6 to 12 months; if stable, then follow-up repeat CT 18 to 24 months from today since he has a history of renal cell carcinoma. 6. Coronary calcifications with prior lad stenting. Electronically signed by: Francis Quam MD 11/25/2023 04:39 AM EDT RP Workstation: [id number]    There are no new results to review at this time.  Previous records (including but not limited to H&P, progress notes, nursing notes, TOC management) were reviewed in assessment of this patient.  Labs: CBC: Recent Labs  Lab 11/25/23 0150 11/25/23 0201 11/26/23 1003  WBC 5.5  --  5.0  HGB 15.4 15.6 15.2  HCT 44.6 46.0 45.3  MCV 90.1  --  94.4  PLT 159  --  128*   Basic Metabolic Panel: Recent Labs  Lab 11/25/23 0150 11/25/23 0201 11/26/23 1003  NA 137 139 138  K 4.1 4.1 3.7  CL 102 105 105  CO2 20*  --  20*  GLUCOSE 99 94 119*  BUN 10 11 8   CREATININE 1.22 1.30* 1.14  CALCIUM  9.9  --  8.5*  MG  --   --  1.9   Liver Function Tests: Recent Labs  Lab 11/26/23 1003  AST 77*  ALT 24  ALKPHOS 63  BILITOT 0.5  PROT 6.0*  ALBUMIN 2.9*   CBG: No results for input(s): GLUCAP in the last 168 hours.  Scheduled Meds:  amLODipine  10 mg Oral Daily   aspirin  EC  81 mg Oral Daily   atorvastatin   80 mg Oral Daily   clopidogrel  75 mg Oral Daily   losartan  50 mg Oral Daily   Continuous Infusions:  heparin  1,000 Units/hr (11/26/23 0056)   nitroGLYCERIN  5 mcg/min (11/26/23 0623)   PRN Meds:.acetaminophen , ondansetron  (ZOFRAN ) IV  Family Communication: None at bedside  Disposition: Status is: Inpatient Remains inpatient appropriate because: NSTEMI     Time spent: 34 minutes  Length of inpatient stay: 1 days  Author: Carliss Stephen Canales, DO 11/26/2023 1:15 PM  For on call review [email or web address].

## 2023-11-26 NOTE — Progress Notes (Addendum)
 Rounding Note   Patient Name: Stephen [name] Date of Encounter: 11/26/2023  [facility] Cardiologist: Oneil Parchment, MD   Subjective No complaints and no events overnight  Scheduled Meds:  amLODipine  10 mg Oral Daily   aspirin  EC  81 mg Oral Daily   atorvastatin   80 mg Oral Daily   clopidogrel  75 mg Oral Daily   losartan  50 mg Oral Daily   Continuous Infusions:  heparin  1,000 Units/hr (11/26/23 0056)   nitroGLYCERIN  5 mcg/min (11/26/23 0623)   PRN Meds: acetaminophen , ondansetron  (ZOFRAN ) IV   Vital Signs  Vitals:   11/25/23 2342 11/26/23 0411 11/26/23 0739 11/26/23 1015  BP: (!) 166/83 (!) 178/90 (!) 167/84 (!) 167/84  Pulse: 69 60 68   Resp: 17 18 19    Temp: 98.4 F (36.9 C) 98.7 F (37.1 C) 98.5 F (36.9 C)   TempSrc: Oral Oral Oral   SpO2: 98% 96% 100%   Weight:      Height:        Intake/Output Summary (Last 24 hours) at 11/26/2023 1217 Last data filed at 11/26/2023 9376 Gross per 24 hour  Intake 1407.85 ml  Output 620 ml  Net 787.85 ml      11/25/2023    6:00 PM 11/25/2023    3:06 AM 09/08/2023   10:06 PM  Last 3 Weights  Weight (lbs) 159 lb 13.3 oz 160 lb 15 oz 160 lb 15 oz  Weight (kg) 72.5 kg 73 kg 73 kg      Telemetry NSR- Personally Reviewed  ECG  No new ECG  Physical Exam  GEN: No acute distress.   Neck: No JVD Cardiac: RRR, no murmurs, rubs, or gallops.  Respiratory: Clear to auscultation bilaterally. GI: Soft, nontender, non-distended  MS: No edema; No deformity. Neuro:  Nonfocal  Psych: Normal affect   Labs High Sensitivity Troponin:   Recent Labs  Lab 11/25/23 0150 11/25/23 0340 11/25/23 0831 11/25/23 1003  TROPONINIHS 779* 1,372* 5,199* 7,295*     Chemistry Recent Labs  Lab 11/25/23 0150 11/25/23 0201 11/26/23 1003  NA 137 139 138  K 4.1 4.1 3.7  CL 102 105 105  CO2 20*  --  20*  GLUCOSE 99 94 119*  BUN 10 11 8   CREATININE 1.22 1.30* 1.14  CALCIUM  9.9  --  8.5*  MG  --   --  1.9  PROT  --    --  6.0*  ALBUMIN  --   --  2.9*  AST  --   --  77*  ALT  --   --  24  ALKPHOS  --   --  63  BILITOT  --   --  0.5  GFRNONAA >60  --  >60  ANIONGAP 15  --  13    Lipids  Recent Labs  Lab 11/26/23 1003  CHOL 132  TRIG 84  HDL 52  LDLCALC 63  CHOLHDL 2.5    Hematology Recent Labs  Lab 11/25/23 0150 11/25/23 0201 11/26/23 1003  WBC 5.5  --  5.0  RBC 4.95  --  4.80  HGB 15.4 15.6 15.2  HCT 44.6 46.0 45.3  MCV 90.1  --  94.4  MCH 31.1  --  31.7  MCHC 34.5  --  33.6  RDW 13.0  --  13.2  PLT 159  --  128*   Thyroid No results for input(s): TSH, FREET4 in the last 168 hours.  BNPNo results for input(s): BNP, PROBNP in  the last 168 hours.  DDimer No results for input(s): DDIMER in the last 168 hours.   Radiology  ECHOCARDIOGRAM COMPLETE Result Date: 11/25/2023    ECHOCARDIOGRAM REPORT   Patient Name:   Stephen [name] Date of Exam: 11/25/2023 Medical Rec #:  [id number]       Height:       70.0 in Accession #:    [id number]      Weight:       160.9 lb Date of Birth:  [birth year]       BSA:          1.903 m Patient Age:    58 years        BP:           152/85 mmHg Patient Gender: M               HR:           62 bpm. Exam Location:  Inpatient Procedure: 2D Echo, Cardiac Doppler and Color Doppler (Both Spectral and Color            Flow Doppler were utilized during procedure). Indications:    122-I22.9 Subsequent ST elevation (STEM) and non-ST elevation                 (NSTEMI) myocardial infarction  History:        Patient has prior history of Echocardiogram examinations, most                 recent 04/03/2017. CAD, Previous Myocardial Infarction and Acute                 MI, Stroke, Signs/Symptoms:Chest Pain, Altered Mental Status and                 Syncope; Risk Factors:Dyslipidemia, Hypertension, Diabetes and                 Current Smoker. Cocaine abuse.  Sonographer:    Ellouise Mose RDCS Referring Phys: [id number] DEREK LELON CANALES IMPRESSIONS  1. Left ventricular ejection  fraction, by estimation, is 55 to 60%. The left ventricle has normal function. The left ventricle has no regional wall motion abnormalities. There is mild left ventricular hypertrophy. Left ventricular diastolic parameters were normal.  2. Right ventricular systolic function is normal. The right ventricular size is normal. Tricuspid regurgitation signal is inadequate for assessing PA pressure.  3. The mitral valve is abnormal. Mild mitral valve regurgitation. No evidence of mitral stenosis.  4. The aortic valve is tricuspid. Aortic valve regurgitation is not visualized. No aortic stenosis is present.  5. The inferior vena cava is normal in size with greater than 50% respiratory variability, suggesting right atrial pressure of 3 mmHg. FINDINGS  Left Ventricle: Left ventricular ejection fraction, by estimation, is 55 to 60%. The left ventricle has normal function. The left ventricle has no regional wall motion abnormalities. The left ventricular internal cavity size was normal in size. There is  mild left ventricular hypertrophy. Left ventricular diastolic parameters were normal. Right Ventricle: The right ventricular size is normal. Right vetricular wall thickness was not well visualized. Right ventricular systolic function is normal. Tricuspid regurgitation signal is inadequate for assessing PA pressure. Left Atrium: Left atrial size was normal in size. Right Atrium: Right atrial size was normal in size. Pericardium: There is no evidence of pericardial effusion. Mitral Valve: The mitral valve is abnormal. Mild mitral valve regurgitation. No evidence of mitral valve stenosis. Tricuspid Valve: The tricuspid valve  is normal in structure. Tricuspid valve regurgitation is trivial. No evidence of tricuspid stenosis. Aortic Valve: The aortic valve is tricuspid. Aortic valve regurgitation is not visualized. No aortic stenosis is present. Aortic valve mean gradient measures 2.9 mmHg. Aortic valve peak gradient measures 6.6  mmHg. Aortic valve area, by VTI measures 3.71 cm. Pulmonic Valve: The pulmonic valve was not well visualized. Pulmonic valve regurgitation is trivial. No evidence of pulmonic stenosis. Aorta: The aortic root and ascending aorta are structurally normal, with no evidence of dilitation. Venous: The inferior vena cava is normal in size with greater than 50% respiratory variability, suggesting right atrial pressure of 3 mmHg. IAS/Shunts: No atrial level shunt detected by color flow Doppler.  LEFT VENTRICLE PLAX 2D LVIDd:         4.67 cm      Diastology LVIDs:         3.01 cm      LV e' medial:    10.10 cm/s LV PW:         1.10 cm      LV E/e' medial:  7.1 LV IVS:        1.23 cm      LV e' lateral:   11.60 cm/s LVOT diam:     2.43 cm      LV E/e' lateral: 6.1 LV SV:         96 LV SV Index:   50 LVOT Area:     4.64 cm LV IVRT:       134 msec  LV Volumes (MOD) LV vol d, MOD A2C: 110.0 ml LV vol d, MOD A4C: 88.4 ml LV vol s, MOD A2C: 48.3 ml LV vol s, MOD A4C: 40.0 ml LV SV MOD A2C:     61.7 ml LV SV MOD A4C:     88.4 ml LV SV MOD BP:      57.9 ml RIGHT VENTRICLE             IVC RV S prime:     14.70 cm/s  IVC diam: 1.45 cm TAPSE (M-mode): 2.0 cm LEFT ATRIUM             Index        RIGHT ATRIUM           Index LA diam:        3.37 cm 1.77 cm/m   RA Area:     14.20 cm LA Vol (A2C):   48.3 ml 25.38 ml/m  RA Volume:   32.40 ml  17.02 ml/m LA Vol (A4C):   48.4 ml 25.43 ml/m LA Biplane Vol: 50.8 ml 26.69 ml/m  AORTIC VALVE AV Area (Vmax):    3.47 cm AV Area (Vmean):   3.65 cm AV Area (VTI):     3.71 cm AV Vmax:           128.33 cm/s AV Vmean:          78.595 cm/s AV VTI:            0.257 m AV Peak Grad:      6.6 mmHg AV Mean Grad:      2.9 mmHg LVOT Vmax:         95.90 cm/s LVOT Vmean:        61.800 cm/s LVOT VTI:          0.206 m LVOT/AV VTI ratio: 0.80  AORTA Ao Root diam: 3.24 cm Ao Asc diam:  3.61 cm MITRAL VALVE MV Area (PHT):  3.72 cm    SHUNTS MV Decel Time: 204 msec    Systemic VTI:  0.21 m MV E velocity:  71.30 cm/s  Systemic Diam: 2.43 cm MV A velocity: 49.80 cm/s MV E/A ratio:  1.43 Dorn Ross MD Electronically signed by Dorn Ross MD Signature Date/Time: 11/25/2023/4:40:44 PM    Final    DG Chest Portable 1 View Result Date: 11/25/2023 EXAM: 1 VIEW(S) XRAY OF THE CHEST 11/25/2023 02:59:00 AM COMPARISON: PA and lateral chest 09/08/2023. CLINICAL HISTORY: cp. cp cp. cp FINDINGS: LUNGS AND PLEURA: No focal pulmonary opacity. No pulmonary edema. No pleural effusion. No pneumothorax. HEART AND MEDIASTINUM: No acute abnormality of the cardiac and mediastinal silhouettes. A stent is again noted in the lad coronary artery. BONES AND SOFT TISSUES: No acute osseous abnormality. IMPRESSION: 1. No acute cardiopulmonary process. Stable chest. Electronically signed by: Francis Quam MD 11/25/2023 04:41 AM EDT RP Workstation: [id number]   CT Angio Chest/Abd/Pel for Dissection W and/or Wo Contrast Result Date: 11/25/2023 EXAM: CTA CHEST, ABDOMEN AND PELVIS WITH AND WITHOUT CONTRAST 11/25/2023 02:36:00 AM TECHNIQUE: Noncontrast CT of the chest was initially obtained. CTA of the chest was performed with the administration of 100 mL of intravenous iohexol (OMNIPAQUE) 350 MG/ML injection. CTA of the abdomen and pelvis was performed with administration of 100 mL of intravenous iohexol (OMNIPAQUE) 350 MG/ML injection. Multiplanar reformatted images are provided for review. MIP images are provided for review. Automated exposure control, iterative reconstruction, and/or weight based adjustment of the mA/kV was utilized to reduce the radiation dose to as low as reasonably achievable. COMPARISON: CT abdomen and pelvis without contrast 05/31/2017, CT abdomen and pelvis with contrast 01/29/2017. No prior chest CT or CTA. CLINICAL HISTORY: Acute aortic syndrome (AAS) suspected. Chief complaints; Chest Pain; CT Angio Chest/Abd/Pel for Dissection W and/or Wo Contrast; Acute aortic syndrome (AAS) suspected; 100 OMNI350 GIVEN IN  2oG IV RAC; Without Incident. FINDINGS: VASCULATURE: AORTA: The thoracic aorta and great vessels are normal in course and caliber. There is a small amount of scattered calcification in the descending aorta and minimal calcification in the great vessels. . In the abdominal aorta, there are moderate patchy calcific plaques primarily in the infrarenal segment. There is no thoracic or abdominal aortic aneurysm, stenosis, or dissection. PULMONARY ARTERIES: The pulmonary arteries are well opacified, normal caliber, and do not show evidence of emboli to the segmental level. The pulmonary veins are nondilated. SABRA GREAT VESSELS OF AORTIC ARCH: Normal course and caliber. Minimal calcification. No dissection. No arterial occlusion or significant stenosis. CELIAC TRUNK: No acute finding. No occlusion or significant stenosis. SUPERIOR MESENTERIC ARTERY: No acute finding. No occlusion or significant stenosis. 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 RENAL ARTERIES: Both renal arteries are patent. There are scattered calcific plaques in some of the bilateral renal artery hilar branches. No occlusion or significant stenosis of the main renal arteries. ILIAC ARTERIES: The iliac arteries demonstrate patchy calcific plaques but no flow limiting stenosis. CHEST: MEDIASTINUM: No mediastinal lymphadenopathy. The cardiac size is normal. There is no pericardial effusion. There is a stent in the mid to distal LAD coronary artery, and scattered calcific plaque in the right and circumflex coronary arteries. LUNGS AND PLEURA: The lungs are clear of infiltrates. Above the plane of both prior abdomen and pelvis CTs, there is a rounded 6 mm solid nodule on series 8, axial image 62 in the right middle lobe. There is a chronic linear scarlike  opacity in the lingular base. Right lower lobe paraspinal scarring also noted adjacent to osteophytes of  the thoracic spine. There is no consolidation, effusion or pneumothorax. THORACIC BONES AND SOFT TISSUES: Spondylosis of the thoracic spine. No acute bone or soft tissue abnormality. ABDOMEN AND PELVIS: LIVER: The liver is mildly steatotic, including with focal periligamentous fat in segment 4b. There is no mass enhancement. GALLBLADDER AND BILE DUCTS: The gallbladder has been removed since the last CT. There is no biliary dilatation. SPLEEN: The spleen is unremarkable. PANCREAS: The pancreas is unremarkable. ADRENAL GLANDS: Bilateral adrenal glands demonstrate no acute abnormality. KIDNEYS, URETERS AND BLADDER: There previously was a heterogeneous mass in the inferior pole of the right kidney which has been resected in the interval. There is a Bosniak 1 cyst in the mid pole of this kidney, measuring 1.1 cm and 13 Hounsfield units requiring no follow-up. No renal mass enhancement is seen. No urinary stone or obstruction. The bladder is unremarkable. GI AND BOWEL: Stomach and duodenal sweep demonstrate no acute abnormality. There is no bowel obstruction. No abnormal bowel wall thickening or distension. REPRODUCTIVE: Reproductive organs are unremarkable. PERITONEUM AND RETROPERITONEUM: No ascites or free air. LYMPH NODES: No lymphadenopathy. ABDOMINAL BONES AND SOFT TISSUES: Spondylosis of the lumbar spine. There is a small umbilical hernia containing fat. . There is no incarcerated hernia. No acute or other significant osseous findings. No chest or abdominal wall masses. IMPRESSION: 1. No evidence of acute aortic syndrome, including dissection or aneurysm. 2. No evidence of pulmonary embolism. 3. 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 4. Moderate patchy calcific plaques in the infrarenal abdominal aorta without aneurysm or stenosis, and patchy calcific plaques in the iliac arteries without flow-limiting stenosis. SABRA 5. 6 mm solitary right middle  lobe nodule. . Chest CT is recommended at 6 to 12 months; if stable, then follow-up repeat CT 18 to 24 months from today since he has a history of renal cell carcinoma. 6. Coronary calcifications with prior lad stenting. Electronically signed by: Francis Quam MD 11/25/2023 04:39 AM EDT RP Workstation: [id number]    Cardiac Studies   TTE 11/25/23:  IMPRESSIONS     1. Left ventricular ejection fraction, by estimation, is 55 to 60%. The  left ventricle has normal function. The left ventricle has no regional  wall motion abnormalities. There is mild left ventricular hypertrophy.  Left ventricular diastolic parameters  were normal.   2. Right ventricular systolic function is normal. The right ventricular  size is normal. Tricuspid regurgitation signal is inadequate for assessing  PA pressure.   3. The mitral valve is abnormal. Mild mitral valve regurgitation. No  evidence of mitral stenosis.   4. The aortic valve is tricuspid. Aortic valve regurgitation is not  visualized. No aortic stenosis is present.   5. The inferior vena cava is normal in size with greater than 50%  respiratory variability, suggesting right atrial pressure of 3 mmHg.   Patient Profile   Stephen [name] is a 58 y.o. male with a hx of CAD s/p MI (known CTO of RCA, PCI to LAD '22), CVA, DM, HTN, bipolar, seizures, schizophrenia, cocaine use, alcohol use, tobacco use who is being seen 11/25/2023 for the evaluation of chest pain/NSTEMI at the request of Dr. Carita.   Assessment & Plan   #NSTEMI #CAD s/p Prior PCI :: Planning for [facility] tomorrow.  No new changes. -N.p.o. at midnight -Plan for [facility] on Monday - Continue heparin  drip - Continue  nitroglycerin  drip - Continue aspirin  81 mg daily - Continue Plavix 75 mg daily -Continue home atorvastatin  80 - Check lipid panel   #HTN - Increase losartan to 50 mg daily - Continue amlodipine 10 mg daily        For questions or updates, please contact [facility] Please consult [email or web address] for contact info under       Signed, Georganna Archer, MD  11/26/2023, 12:17 PM

## 2023-11-27 ENCOUNTER — Encounter (HOSPITAL_COMMUNITY): Payer: Self-pay | Admitting: Internal Medicine

## 2023-11-27 ENCOUNTER — Other Ambulatory Visit (HOSPITAL_COMMUNITY): Payer: Self-pay

## 2023-11-27 ENCOUNTER — Encounter (HOSPITAL_COMMUNITY)
Admission: EM | Disposition: A | Discharge status: Home / Self Care | Payer: Self-pay | Source: Home / Self Care | Attending: Internal Medicine

## 2023-11-27 DIAGNOSIS — E785 Hyperlipidemia, unspecified: Secondary | ICD-10-CM

## 2023-11-27 DIAGNOSIS — I2510 Atherosclerotic heart disease of native coronary artery without angina pectoris: Secondary | ICD-10-CM

## 2023-11-27 DIAGNOSIS — F3170 Bipolar disorder, currently in remission, most recent episode unspecified: Secondary | ICD-10-CM

## 2023-11-27 DIAGNOSIS — I25119 Atherosclerotic heart disease of native coronary artery with unspecified angina pectoris: Secondary | ICD-10-CM

## 2023-11-27 LAB — CBC
HCT: 41.8 % (ref 39.0–52.0)
Hemoglobin: 14.6 g/dL (ref 13.0–17.0)
MCH: 31 pg (ref 26.0–34.0)
MCHC: 34.9 g/dL (ref 30.0–36.0)
MCV: 88.7 fL (ref 80.0–100.0)
Platelets: 119 K/uL — ABNORMAL LOW (ref 150–400)
RBC: 4.71 MIL/uL (ref 4.22–5.81)
RDW: 12.8 % (ref 11.5–15.5)
WBC: 5.4 K/uL (ref 4.0–10.5)
nRBC: 0 % (ref 0.0–0.2)

## 2023-11-27 LAB — BASIC METABOLIC PANEL WITH GFR
Anion gap: 10 (ref 5–15)
BUN: <5 mg/dL — ABNORMAL LOW (ref 6–20)
CO2: 23 mmol/L (ref 22–32)
Calcium: 8.5 mg/dL — ABNORMAL LOW (ref 8.9–10.3)
Chloride: 106 mmol/L (ref 98–111)
Creatinine, Ser: 1 mg/dL (ref 0.61–1.24)
GFR, Estimated: >60 mL/min (ref >60)
Glucose, Bld: 92 mg/dL (ref 70–99)
Potassium: 3.6 mmol/L (ref 3.5–5.1)
Sodium: 139 mmol/L (ref 135–145)

## 2023-11-27 LAB — POCT ACTIVATED CLOTTING TIME: Activated Clotting Time: 320 s

## 2023-11-27 LAB — TYPE AND SCREEN
ABO/RH(D): A POS
Antibody Screen: NEGATIVE

## 2023-11-27 LAB — HEPARIN LEVEL (UNFRACTIONATED): Heparin Unfractionated: 0.39 [IU]/mL (ref 0.30–0.70)

## 2023-11-27 LAB — PROTIME-INR
INR: 1 (ref 0.8–1.2)
Prothrombin Time: 13.7 s (ref 11.4–15.2)

## 2023-11-27 LAB — ABO/RH: ABO/RH(D): A POS

## 2023-11-27 SURGERY — LEFT HEART CATH AND CORONARY ANGIOGRAPHY
Anesthesia: LOCAL

## 2023-11-27 MED ORDER — LIDOCAINE HCL (PF) 1 % IJ SOLN
INTRAMUSCULAR | Status: AC
  Filled 2023-11-27: qty 30

## 2023-11-27 MED ORDER — MIDAZOLAM HCL (PF) 2 MG/2ML IJ SOLN
INTRAMUSCULAR | Status: DC | PRN
  Administered 2023-11-27 (×2): 1 mg via INTRAVENOUS

## 2023-11-27 MED ORDER — HEPARIN SODIUM (PORCINE) 1000 UNIT/ML IJ SOLN
INTRAMUSCULAR | Status: AC
Start: 2023-11-27 — End: 2023-11-27
  Filled 2023-11-27: qty 10

## 2023-11-27 MED ORDER — ATORVASTATIN CALCIUM 80 MG PO TABS
80.0000 mg | ORAL_TABLET | Freq: Every day | ORAL | 0 refills | Status: AC
  Filled 2023-11-27: qty 90, 90d supply, fill #0

## 2023-11-27 MED ORDER — OXYCODONE HCL 5 MG PO TABS
5.0000 mg | ORAL_TABLET | ORAL | 0 refills | Status: AC | PRN
  Filled 2023-11-27: qty 14, 3d supply, fill #0

## 2023-11-27 MED ORDER — CLONIDINE HCL 0.2 MG PO TABS
0.2000 mg | ORAL_TABLET | Freq: Two times a day (BID) | ORAL | 0 refills | Status: DC
  Filled 2023-11-27: qty 60, 30d supply, fill #0

## 2023-11-27 MED ORDER — AMLODIPINE BESYLATE 10 MG PO TABS
10.0000 mg | ORAL_TABLET | Freq: Every day | ORAL | 0 refills | Status: AC
  Filled 2023-11-27: qty 30, 30d supply, fill #0

## 2023-11-27 MED ORDER — HEPARIN SODIUM (PORCINE) 1000 UNIT/ML IJ SOLN
INTRAMUSCULAR | Status: DC | PRN
  Administered 2023-11-27: 3500 [IU] via INTRAVENOUS
  Administered 2023-11-27: 4000 [IU] via INTRAVENOUS

## 2023-11-27 MED ORDER — IOHEXOL 350 MG/ML SOLN
INTRAVENOUS | Status: DC | PRN
  Administered 2023-11-27: 55 mL

## 2023-11-27 MED ORDER — FENTANYL CITRATE (PF) 100 MCG/2ML IJ SOLN
INTRAMUSCULAR | Status: AC
  Filled 2023-11-27: qty 2

## 2023-11-27 MED ORDER — HEPARIN (PORCINE) IN NACL 1000-0.9 UT/500ML-% IV SOLN
INTRAVENOUS | Status: DC | PRN
  Administered 2023-11-27: 1000 mL

## 2023-11-27 MED ORDER — HYDRALAZINE HCL 20 MG/ML IJ SOLN: 10.0000 mg | INTRAMUSCULAR | Status: DC | PRN

## 2023-11-27 MED ORDER — CARVEDILOL 3.125 MG PO TABS
3.1250 mg | ORAL_TABLET | Freq: Two times a day (BID) | ORAL | 0 refills | Status: AC
  Filled 2023-11-27: qty 180, 90d supply, fill #0

## 2023-11-27 MED ORDER — NITROGLYCERIN 0.4 MG SL SUBL
0.4000 mg | SUBLINGUAL_TABLET | SUBLINGUAL | 0 refills | Status: AC | PRN
  Filled 2023-11-27: qty 25, 8d supply, fill #0

## 2023-11-27 MED ORDER — NITROGLYCERIN 1 MG/10 ML FOR IR/CATH LAB
INTRA_ARTERIAL | Status: DC | PRN
  Administered 2023-11-27: 200 ug via INTRACORONARY

## 2023-11-27 MED ORDER — ASPIRIN 81 MG PO TBEC
81.0000 mg | DELAYED_RELEASE_TABLET | Freq: Every day | ORAL | 12 refills | Status: AC
  Filled 2023-11-27: qty 30, 30d supply, fill #0

## 2023-11-27 MED ORDER — SODIUM CHLORIDE 0.9% FLUSH: 3.0000 mL | INTRAVENOUS | Status: DC | PRN

## 2023-11-27 MED ORDER — VERAPAMIL HCL 2.5 MG/ML IV SOLN
INTRAVENOUS | Status: AC
  Filled 2023-11-27: qty 2

## 2023-11-27 MED ORDER — SODIUM CHLORIDE 0.9 % IV SOLN: 250.0000 mL | INTRAVENOUS | Status: DC | PRN

## 2023-11-27 MED ORDER — LOSARTAN POTASSIUM 100 MG PO TABS
100.0000 mg | ORAL_TABLET | Freq: Every day | ORAL | 0 refills | Status: AC
  Filled 2023-11-27: qty 90, 90d supply, fill #0

## 2023-11-27 MED ORDER — LABETALOL HCL 5 MG/ML IV SOLN: 10.0000 mg | INTRAVENOUS | Status: DC | PRN

## 2023-11-27 MED ORDER — HEPARIN (PORCINE) IN NACL 2-0.9 UNITS/ML
INTRAMUSCULAR | Status: DC | PRN
  Administered 2023-11-27: 10 mL via INTRA_ARTERIAL

## 2023-11-27 MED ORDER — CLOPIDOGREL BISULFATE 75 MG PO TABS
75.0000 mg | ORAL_TABLET | Freq: Every day | ORAL | 0 refills | Status: AC
  Filled 2023-11-27: qty 90, 90d supply, fill #0

## 2023-11-27 MED ORDER — LIDOCAINE HCL (PF) 1 % IJ SOLN
INTRAMUSCULAR | Status: DC | PRN
  Administered 2023-11-27: 2 mL via INTRADERMAL

## 2023-11-27 MED ORDER — NITROGLYCERIN 1 MG/10 ML FOR IR/CATH LAB
INTRA_ARTERIAL | Status: DC
Start: 2023-11-27 — End: 2023-11-27
  Filled 2023-11-27: qty 10

## 2023-11-27 MED ORDER — LIDOCAINE 5 % EX PTCH
1.0000 | MEDICATED_PATCH | CUTANEOUS | 0 refills | Status: AC
  Filled 2023-11-27: qty 30, 30d supply, fill #0

## 2023-11-27 MED ORDER — ENOXAPARIN SODIUM 40 MG/0.4ML IJ SOSY: 40.0000 mg | PREFILLED_SYRINGE | INTRAMUSCULAR | Status: DC

## 2023-11-27 MED ORDER — FREE WATER
500.0000 mL | Freq: Once | Status: AC
  Administered 2023-11-27: 500 mL via ORAL

## 2023-11-27 MED ORDER — FENTANYL CITRATE (PF) 100 MCG/2ML IJ SOLN
INTRAMUSCULAR | Status: DC | PRN
  Administered 2023-11-27 (×2): 25 ug via INTRAVENOUS

## 2023-11-27 MED ORDER — MIDAZOLAM HCL 2 MG/2ML IJ SOLN
INTRAMUSCULAR | Status: AC
  Filled 2023-11-27: qty 2

## 2023-11-27 MED ORDER — SODIUM CHLORIDE 0.9% FLUSH
3.0000 mL | Freq: Two times a day (BID) | INTRAVENOUS | Status: DC
  Administered 2023-11-27: 3 mL via INTRAVENOUS

## 2023-11-27 SURGICAL SUPPLY — 9 items
CATH 5FR JL3.5 JR4 ANG PIG MP (CATHETERS) IMPLANT
CATH LAUNCHER 6FR EBU3.5 (CATHETERS) IMPLANT
DEVICE RAD COMP TR BAND LRG (VASCULAR PRODUCTS) IMPLANT
GLIDESHEATH SLEND SS 6F .021 (SHEATH) IMPLANT
GUIDEWIRE INQWIRE 1.5J.035X260 (WIRE) IMPLANT
GUIDEWIRE PRESSURE X 175 (WIRE) IMPLANT
KIT ESSENTIALS PG (KITS) IMPLANT
PACK CARDIAC CATHETERIZATION (CUSTOM PROCEDURE TRAY) ×1 IMPLANT
SET ATX-X65L (MISCELLANEOUS) IMPLANT

## 2023-11-27 NOTE — TOC Transition Note (Signed)
 Transition of Care [facility]) - Discharge Note   Patient Details  Name: Stephen [name] MRN: [id number] Date of Birth: 12-Mar-1965  [facility]) CM/SW Contact:  Waddell Barnie Rama, RN Phone Number: 11/27/2023, 11:39 AM   Clinical Narrative:    For dc today, NCM assisted with Match Card, will get a buss pass in the [employer],  NCM gave housing resources and food resources.          Patient Goals and CMS Choice            Discharge Placement                       Discharge Plan and Services Additional resources added to the After Visit Summary for                                       Social Drivers of Health (SDOH) Interventions SDOH Screenings   Food Insecurity: Food Insecurity Present (11/25/2023)  Housing: High Risk (11/25/2023)  Transportation Needs: Unmet Transportation Needs (11/25/2023)  Utilities: Patient Declined (11/25/2023)  Financial Resource Strain: Low Risk  (12/25/2020)   Received from [facility]  Tobacco Use: Medium Risk (11/25/2023)     Readmission Risk Interventions    11/27/2023   11:16 AM  Readmission Risk Prevention Plan  Post Dischage Appt Complete  Medication Screening Complete  Transportation Screening Complete

## 2023-11-27 NOTE — Progress Notes (Deleted)
 MATCH MEDICATION ASSISTANCE CARD Pharmacies please call [phone] for claim processing assistance.  Rx BIN: [id number] Rx Group: [id number] Rx PCN: [id number] Relationship Code: 1 Person Code: 01  Patient ID (MRN): [id number]    Patient Name: Stephen [name]   Patient DOB: Jun 09, 2065   Discharge Date:11/27/23  Expiration Date:12/05/23 (must be filled within 7 days of discharge)

## 2023-11-27 NOTE — Progress Notes (Signed)
 MATCH MEDICATION ASSISTANCE CARD Pharmacies please call [phone] for claim processing assistance.  Rx BIN: [id number] Rx Group: [id number] Rx PCN: [id number] Relationship Code: 1 Person Code: 01  Patient ID (MRN): [id number]    Patient Name: Stephen [name]   Patient DOB: Jun 09, 2065   Discharge Date:11/27/23  Expiration Date:12/05/23 (must be filled within 7 days of discharge)

## 2023-11-27 NOTE — Discharge Summary (Signed)
 Physician Discharge Summary   Patient: Stephen [name] MRN: [id number] DOB: 1965-08-02  Admit date:     11/25/2023  Discharge date: 11/27/23  Discharge Physician: Carliss LELON Canales   PCP: Patient, No Pcp Per   Recommendations at discharge:    Pt to be discharged home.   If you experience worsening fever, chills, chest pain, shortness of breath, or other concerning symptoms, please call your PCP or go to the emergency department immediately.  Discharge Diagnoses: Principal Problem:   NSTEMI (non-ST elevated myocardial infarction) [facility]) Active Problems:   Chest pain   Essential hypertension   Hyperlipidemia   CAD (coronary artery disease)   Bipolar disorder (HCC)   Seizure (HCC)  Resolved Problems:   * No resolved hospital problems. *   Hospital Course:  58 y.o. male with history of CAD s/p MI (known CTO of RCA, PCI to LAD '22), CVA, DM, HTN, bipolar, seizures, schizophrenia, cocaine use, alcohol use, tobacco use presenting with chest pain. Found to have NSTEMI.   Assessment and Plan:   Atypical chest pain/NSTEMI - Retrosternal chest pain no ST changes on ECG but noted elevations in high-sensitivity troponin suggestive of NSTEMI.  Cardiology consulted and following closely.  Left heart cath performed this morning  showed patent LAD stent and stable CTO of the RCA. There is a small to medium caliber OM2 branch with a 90% stenosis that is most likely the culprit for his NSTEMI but too small for PCI.  Recommending continued medical management.  Continue aspirin , Plavix, atorvastatin  80 mg.  Blood pressure control as noted above below.  Provide prescriptions delivered to patient prior to discharge.  Recommend follow-up with cardiology in the outpatient setting.  CAD with PCI -CAD with multiple PCI's and was found to have subtotal occlusion of mid RCA , PCI 11/2020 which resulted in possible root dissection/perforation.  A repeat attempt at [facility] 03/2021 was also unsuccessful.  NSTEMI  03/2022 at [facility] with successful balloon angioplasty of D2 and kissing balloon of mid LAD with IVUS guided PCI of mid LAD overlapping old stent.   Uncontrolled hypertension - Medication regimen changed to losartan 100 mg daily, carvedilol 3.125 mg twice daily, amlodipine 10 mg daily.  Prescriptions provided prior to discharge.  Follow-up closely with cardiology in the outpatient setting to titrate antihypertensive regimen.   Hyperlipidemia - Atorvastatin  80 mg daily.   Polysubstance use - Patient was taking cocaine a couple days ago.  Encourage cessation.     Medication noncompliance - Patient is homeless, has not been on any cardiac medications.  Providing refills/scripts prior to discharge.  Encourage close follow-up with cardiology in the outpatient setting.  Polar disorder/schizophrenia/seizure disorder - Patient has not been on any medications recently, not restarting any medication.   Low back pain (POA) - Back gave out prior to admission, one of his presenting complaints.  Initiated on lidocaine  patch with oxycodone  with improvement in symptoms.  Will give prescription for lidocaine  patch as well as short course of oxycodone .  Goals of care - Patient is homeless.  Will work with case management, patient, family on disposition planning.   Consultants: Cardiology Procedures performed: Left heart cath Disposition: Home Diet recommendation:  Discharge Diet Orders (From admission, onward)     Start     Ordered   11/27/23 0000  Diet - low sodium heart healthy        11/27/23 1058           Cardiac diet  DISCHARGE MEDICATION: Allergies as  of 11/27/2023       Reactions   Penicillins Anaphylaxis        Medication List     STOP taking these medications    acetaminophen  325 MG tablet Commonly known as: TYLENOL    isosorbide  mononitrate 60 MG 24 hr tablet Commonly known as: IMDUR    levETIRAcetam  500 MG tablet Commonly known as: KEPPRA    pantoprazole  40 MG  tablet Commonly known as: PROTONIX        TAKE these medications    amLODipine 10 MG tablet Commonly known as: NORVASC Take 1 tablet (10 mg total) by mouth daily. What changed:  medication strength how much to take   aspirin  EC 81 MG tablet Take 1 tablet (81 mg total) by mouth daily. Swallow whole. Start taking on: November 28, 2023   atorvastatin  80 MG tablet Commonly known as: LIPITOR Take 1 tablet (80 mg total) by mouth daily. What changed:  medication strength how much to take when to take this   carvedilol 3.125 MG tablet Commonly known as: COREG Take 1 tablet (3.125 mg total) by mouth 2 (two) times daily with a meal.   cloNIDine 0.2 MG tablet Commonly known as: CATAPRES Take 1 tablet (0.2 mg total) by mouth 2 (two) times daily.   clopidogrel 75 MG tablet Commonly known as: PLAVIX Take 1 tablet (75 mg total) by mouth daily.   lidocaine  5 % Commonly known as: LIDODERM  Place 1 patch onto the skin daily. Remove & Discard patch within 12 hours or as directed by MD   losartan 100 MG tablet Commonly known as: COZAAR Take 1 tablet (100 mg total) by mouth daily. What changed:  medication strength how much to take   nitroGLYCERIN  0.4 MG SL tablet Commonly known as: NITROSTAT  Place 1 tablet (0.4 mg total) under the tongue every 5 (five) minutes as needed for chest pain.   oxyCODONE  5 MG immediate release tablet Commonly known as: Oxy IR/ROXICODONE  Take 1 tablet (5 mg total) by mouth every 4 (four) hours as needed for severe pain (pain score 7-10).         Discharge Exam: Filed Weights   11/25/23 0306 11/25/23 1800  Weight: 73 kg 72.5 kg    GENERAL:  Alert, pleasant, no acute distress  HEENT:  EOMI CARDIOVASCULAR:  RRR, no murmurs appreciated RESPIRATORY:  Clear to auscultation, no wheezing, rales, or rhonchi GASTROINTESTINAL:  Soft, nontender, nondistended EXTREMITIES:  No LE edema bilaterally NEURO:  No new focal deficits appreciated SKIN:  No  rashes noted PSYCH:  Appropriate mood and affect     Condition at discharge: improving  The results of significant diagnostics from this hospitalization (including imaging, microbiology, ancillary and laboratory) are listed below for reference.   Imaging Studies: CARDIAC CATHETERIZATION Result Date: 11/27/2023 Conclusions: Multivessel coronary artery disease, as detailed below, including CTO of RCA, 60% distal LCx stenosis (RFR = 0.97), 90% OM2 lesion (likely culprit for NSTEMI but too small for PCI), and 60% stenosis of jailed D1 branch. Widely patent mid LAD stent. Normal left ventricular filling pressure (LVEDP 10 mmHg). Recommendations: Continue medical therapy and aggressive secondary prevention of coronary artery disease.  Will discontinue NTG infusion and add isosorbide  mononitrate. Continue dual antiplatelet therapy with aspirin  and clopidogrel for at least 12 months. Lonni Hanson, MD [facility]  ECHOCARDIOGRAM COMPLETE Result Date: 11/25/2023    ECHOCARDIOGRAM REPORT   Patient Name:   Stephen [name] Date of Exam: 11/25/2023 Medical Rec #:  [id number]       Height:  70.0 in Accession #:    [id number]      Weight:       160.9 lb Date of Birth:  1965/12/18       BSA:          1.903 m Patient Age:    58 years        BP:           152/85 mmHg Patient Gender: M               HR:           62 bpm. Exam Location:  Inpatient Procedure: 2D Echo, Cardiac Doppler and Color Doppler (Both Spectral and Color            Flow Doppler were utilized during procedure). Indications:    122-I22.9 Subsequent ST elevation (STEM) and non-ST elevation                 (NSTEMI) myocardial infarction  History:        Patient has prior history of Echocardiogram examinations, most                 recent 04/03/2017. CAD, Previous Myocardial Infarction and Acute                 MI, Stroke, Signs/Symptoms:Chest Pain, Altered Mental Status and                 Syncope; Risk Factors:Dyslipidemia, Hypertension,  Diabetes and                 Current Smoker. Cocaine abuse.  Sonographer:    Ellouise Mose RDCS Referring Phys: [id number] Alif Petrak LELON CANALES IMPRESSIONS  1. Left ventricular ejection fraction, by estimation, is 55 to 60%. The left ventricle has normal function. The left ventricle has no regional wall motion abnormalities. There is mild left ventricular hypertrophy. Left ventricular diastolic parameters were normal.  2. Right ventricular systolic function is normal. The right ventricular size is normal. Tricuspid regurgitation signal is inadequate for assessing PA pressure.  3. The mitral valve is abnormal. Mild mitral valve regurgitation. No evidence of mitral stenosis.  4. The aortic valve is tricuspid. Aortic valve regurgitation is not visualized. No aortic stenosis is present.  5. The inferior vena cava is normal in size with greater than 50% respiratory variability, suggesting right atrial pressure of 3 mmHg. FINDINGS  Left Ventricle: Left ventricular ejection fraction, by estimation, is 55 to 60%. The left ventricle has normal function. The left ventricle has no regional wall motion abnormalities. The left ventricular internal cavity size was normal in size. There is  mild left ventricular hypertrophy. Left ventricular diastolic parameters were normal. Right Ventricle: The right ventricular size is normal. Right vetricular wall thickness was not well visualized. Right ventricular systolic function is normal. Tricuspid regurgitation signal is inadequate for assessing PA pressure. Left Atrium: Left atrial size was normal in size. Right Atrium: Right atrial size was normal in size. Pericardium: There is no evidence of pericardial effusion. Mitral Valve: The mitral valve is abnormal. Mild mitral valve regurgitation. No evidence of mitral valve stenosis. Tricuspid Valve: The tricuspid valve is normal in structure. Tricuspid valve regurgitation is trivial. No evidence of tricuspid stenosis. Aortic Valve: The aortic valve is  tricuspid. Aortic valve regurgitation is not visualized. No aortic stenosis is present. Aortic valve mean gradient measures 2.9 mmHg. Aortic valve peak gradient measures 6.6 mmHg. Aortic valve area, by VTI measures 3.71 cm. Pulmonic Valve: The pulmonic valve was  not well visualized. Pulmonic valve regurgitation is trivial. No evidence of pulmonic stenosis. Aorta: The aortic root and ascending aorta are structurally normal, with no evidence of dilitation. Venous: The inferior vena cava is normal in size with greater than 50% respiratory variability, suggesting right atrial pressure of 3 mmHg. IAS/Shunts: No atrial level shunt detected by color flow Doppler.  LEFT VENTRICLE PLAX 2D LVIDd:         4.67 cm      Diastology LVIDs:         3.01 cm      LV e' medial:    10.10 cm/s LV PW:         1.10 cm      LV E/e' medial:  7.1 LV IVS:        1.23 cm      LV e' lateral:   11.60 cm/s LVOT diam:     2.43 cm      LV E/e' lateral: 6.1 LV SV:         96 LV SV Index:   50 LVOT Area:     4.64 cm LV IVRT:       134 msec  LV Volumes (MOD) LV vol d, MOD A2C: 110.0 ml LV vol d, MOD A4C: 88.4 ml LV vol s, MOD A2C: 48.3 ml LV vol s, MOD A4C: 40.0 ml LV SV MOD A2C:     61.7 ml LV SV MOD A4C:     88.4 ml LV SV MOD BP:      57.9 ml RIGHT VENTRICLE             IVC RV S prime:     14.70 cm/s  IVC diam: 1.45 cm TAPSE (M-mode): 2.0 cm LEFT ATRIUM             Index        RIGHT ATRIUM           Index LA diam:        3.37 cm 1.77 cm/m   RA Area:     14.20 cm LA Vol (A2C):   48.3 ml 25.38 ml/m  RA Volume:   32.40 ml  17.02 ml/m LA Vol (A4C):   48.4 ml 25.43 ml/m LA Biplane Vol: 50.8 ml 26.69 ml/m  AORTIC VALVE AV Area (Vmax):    3.47 cm AV Area (Vmean):   3.65 cm AV Area (VTI):     3.71 cm AV Vmax:           128.33 cm/s AV Vmean:          78.595 cm/s AV VTI:            0.257 m AV Peak Grad:      6.6 mmHg AV Mean Grad:      2.9 mmHg LVOT Vmax:         95.90 cm/s LVOT Vmean:        61.800 cm/s LVOT VTI:          0.206 m LVOT/AV VTI  ratio: 0.80  AORTA Ao Root diam: 3.24 cm Ao Asc diam:  3.61 cm MITRAL VALVE MV Area (PHT): 3.72 cm    SHUNTS MV Decel Time: 204 msec    Systemic VTI:  0.21 m MV E velocity: 71.30 cm/s  Systemic Diam: 2.43 cm MV A velocity: 49.80 cm/s MV E/A ratio:  1.43 Dorn Ross MD Electronically signed by Dorn Ross MD Signature Date/Time: 11/25/2023/4:40:44 PM    Final    DG  Chest Portable 1 View Result Date: 11/25/2023 EXAM: 1 VIEW(S) XRAY OF THE CHEST 11/25/2023 02:59:00 AM COMPARISON: PA and lateral chest 09/08/2023. CLINICAL HISTORY: cp. cp cp. cp FINDINGS: LUNGS AND PLEURA: No focal pulmonary opacity. No pulmonary edema. No pleural effusion. No pneumothorax. HEART AND MEDIASTINUM: No acute abnormality of the cardiac and mediastinal silhouettes. A stent is again noted in the lad coronary artery. BONES AND SOFT TISSUES: No acute osseous abnormality. IMPRESSION: 1. No acute cardiopulmonary process. Stable chest. Electronically signed by: Francis Quam MD 11/25/2023 04:41 AM EDT RP Workstation: [id number]   CT Angio Chest/Abd/Pel for Dissection W and/or Wo Contrast Result Date: 11/25/2023 EXAM: CTA CHEST, ABDOMEN AND PELVIS WITH AND WITHOUT CONTRAST 11/25/2023 02:36:00 AM TECHNIQUE: Noncontrast CT of the chest was initially obtained. CTA of the chest was performed with the administration of 100 mL of intravenous iohexol (OMNIPAQUE) 350 MG/ML injection. CTA of the abdomen and pelvis was performed with administration of 100 mL of intravenous iohexol (OMNIPAQUE) 350 MG/ML injection. Multiplanar reformatted images are provided for review. MIP images are provided for review. Automated exposure control, iterative reconstruction, and/or weight based adjustment of the mA/kV was utilized to reduce the radiation dose to as low as reasonably achievable. COMPARISON: CT abdomen and pelvis without contrast 05/31/2017, CT abdomen and pelvis with contrast 01/29/2017. No prior chest CT or CTA. CLINICAL HISTORY: Acute aortic  syndrome (AAS) suspected. Chief complaints; Chest Pain; CT Angio Chest/Abd/Pel for Dissection W and/or Wo Contrast; Acute aortic syndrome (AAS) suspected; 100 [id number] GIVEN IN 2oG IV RAC; Without Incident. FINDINGS: VASCULATURE: AORTA: The thoracic aorta and great vessels are normal in course and caliber. There is a small amount of scattered calcification in the descending aorta and minimal calcification in the great vessels. . In the abdominal aorta, there are moderate patchy calcific plaques primarily in the infrarenal segment. There is no thoracic or abdominal aortic aneurysm, stenosis, or dissection. PULMONARY ARTERIES: The pulmonary arteries are well opacified, normal caliber, and do not show evidence of emboli to the segmental level. The pulmonary veins are nondilated. SABRA GREAT VESSELS OF AORTIC ARCH: Normal course and caliber. Minimal calcification. No dissection. No arterial occlusion or significant stenosis. CELIAC TRUNK: No acute finding. No occlusion or significant stenosis. SUPERIOR MESENTERIC ARTERY: No acute finding. No occlusion or significant stenosis. 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 RENAL ARTERIES: Both renal arteries are patent. There are scattered calcific plaques in some of the bilateral renal artery hilar branches. No occlusion or significant stenosis of the main renal arteries. ILIAC ARTERIES: The iliac arteries demonstrate patchy calcific plaques but no flow limiting stenosis. CHEST: MEDIASTINUM: No mediastinal lymphadenopathy. The cardiac size is normal. There is no pericardial effusion. There is a stent in the mid to distal LAD coronary artery, and scattered calcific plaque in the right and circumflex coronary arteries. LUNGS AND PLEURA: The lungs are clear of infiltrates. Above the plane of both prior abdomen and pelvis CTs, there is a rounded 6 mm solid nodule on series 8, axial  image 62 in the right middle lobe. There is a chronic linear scarlike opacity in the lingular base. Right lower lobe paraspinal scarring also noted adjacent to osteophytes of the thoracic spine. There is no consolidation, effusion or pneumothorax. THORACIC BONES AND SOFT TISSUES: Spondylosis of the thoracic spine. No acute bone or soft tissue abnormality. ABDOMEN AND PELVIS: LIVER: The liver is mildly steatotic, including with focal periligamentous fat in segment 4b.  There is no mass enhancement. GALLBLADDER AND BILE DUCTS: The gallbladder has been removed since the last CT. There is no biliary dilatation. SPLEEN: The spleen is unremarkable. PANCREAS: The pancreas is unremarkable. ADRENAL GLANDS: Bilateral adrenal glands demonstrate no acute abnormality. KIDNEYS, URETERS AND BLADDER: There previously was a heterogeneous mass in the inferior pole of the right kidney which has been resected in the interval. There is a Bosniak 1 cyst in the mid pole of this kidney, measuring 1.1 cm and 13 Hounsfield units requiring no follow-up. No renal mass enhancement is seen. No urinary stone or obstruction. The bladder is unremarkable. GI AND BOWEL: Stomach and duodenal sweep demonstrate no acute abnormality. There is no bowel obstruction. No abnormal bowel wall thickening or distension. REPRODUCTIVE: Reproductive organs are unremarkable. PERITONEUM AND RETROPERITONEUM: No ascites or free air. LYMPH NODES: No lymphadenopathy. ABDOMINAL BONES AND SOFT TISSUES: Spondylosis of the lumbar spine. There is a small umbilical hernia containing fat. . There is no incarcerated hernia. No acute or other significant osseous findings. No chest or abdominal wall masses. IMPRESSION: 1. No evidence of acute aortic syndrome, including dissection or aneurysm. 2. No evidence of pulmonary embolism. 3. 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 4. Moderate patchy calcific plaques in  the infrarenal abdominal aorta without aneurysm or stenosis, and patchy calcific plaques in the iliac arteries without flow-limiting stenosis. SABRA 5. 6 mm solitary right middle lobe nodule. . Chest CT is recommended at 6 to 12 months; if stable, then follow-up repeat CT 18 to 24 months from today since he has a history of renal cell carcinoma. 6. Coronary calcifications with prior lad stenting. Electronically signed by: Francis Quam MD 11/25/2023 04:39 AM EDT RP Workstation: [id number]    Microbiology: Results for orders placed or performed during the hospital encounter of 04/01/17  MRSA PCR Screening     Status: None   Collection Time: 04/01/17  6:34 PM   Specimen: Nasopharyngeal  Result Value Ref Range Status   MRSA by PCR NEGATIVE NEGATIVE Final    Comment:        The GeneXpert MRSA Assay (FDA approved for NASAL specimens only), is one component of a comprehensive MRSA colonization surveillance program. It is not intended to diagnose MRSA infection nor to guide or monitor treatment for MRSA infections. Performed at [facility], [address], [place], [place] [address]     Labs: CBC: Recent Labs  Lab 11/25/23 0150 11/25/23 0201 11/26/23 1003 11/27/23 0219  WBC 5.5  --  5.0 5.4  HGB 15.4 15.6 15.2 14.6  HCT 44.6 46.0 45.3 41.8  MCV 90.1  --  94.4 88.7  PLT 159  --  128* 119*   Basic Metabolic Panel: Recent Labs  Lab 11/25/23 0150 11/25/23 0201 11/26/23 1003 11/27/23 0822  NA 137 139 138 139  K 4.1 4.1 3.7 3.6  CL 102 105 105 106  CO2 20*  --  20* 23  GLUCOSE 99 94 119* 92  BUN 10 11 8  <5*  CREATININE 1.22 1.30* 1.14 1.00  CALCIUM  9.9  --  8.5* 8.5*  MG  --   --  1.9  --    Liver Function Tests: Recent Labs  Lab 11/26/23 1003  AST 77*  ALT 24  ALKPHOS 63  BILITOT 0.5  PROT 6.0*  ALBUMIN 2.9*   CBG: No results for input(s): GLUCAP in the last 168 hours.  Discharge time spent: 30 minutes.  Length of inpatient  stay: 2  days  Signed: Carliss LELON Canales, DO [facility] 11/27/2023

## 2023-11-27 NOTE — H&P (View-Only) (Signed)
 Rounding Note   Patient Name: ADYNN CASERES Date of Encounter: 11/27/2023   [facility] Cardiologist: Oneil Parchment, MD   Subjective - No acute events overnight - Reports ongoing back discomfort but no other complaints  Scheduled Meds:  amLODipine  10 mg Oral Daily   aspirin  EC  81 mg Oral Daily   atorvastatin   80 mg Oral Daily   clopidogrel  75 mg Oral Daily   lidocaine   1 patch Transdermal Q24H   losartan  50 mg Oral Daily   Continuous Infusions:  heparin  1,000 Units/hr (11/27/23 0303)   nitroGLYCERIN  5 mcg/min (11/26/23 0623)   PRN Meds: acetaminophen , ondansetron  (ZOFRAN ) IV, oxyCODONE    Vital Signs  Vitals:   11/26/23 1941 11/26/23 2352 11/27/23 0335 11/27/23 0727  BP: (!) 153/86 (!) 167/94 (!) 163/94 (!) 165/94  Pulse: 66 63 62 60  Resp: 18 20 20 18   Temp: 98.4 F (36.9 C) 98.7 F (37.1 C) 98.5 F (36.9 C) 98.1 F (36.7 C)  TempSrc: Oral Oral Oral Oral  SpO2: 98% 97% 99% 98%  Weight:      Height:        Intake/Output Summary (Last 24 hours) at 11/27/2023 0835 Last data filed at 11/27/2023 0826 Gross per 24 hour  Intake 1240 ml  Output 2770 ml  Net -1530 ml      11/25/2023    6:00 PM 11/25/2023    3:06 AM 09/08/2023   10:06 PM  Last 3 Weights  Weight (lbs) 159 lb 13.3 oz 160 lb 15 oz 160 lb 15 oz  Weight (kg) 72.5 kg 73 kg 73 kg      Telemetry NSR- Personally Reviewed  ECG  No new ECG  Physical Exam  GEN: No acute distress.   Neck: No JVD Cardiac: RRR, no murmurs, rubs, or gallops.  Respiratory: Clear to auscultation bilaterally. GI: Soft, nontender, non-distended  MS: No edema; No deformity. Neuro:  Nonfocal  Psych: Normal affect   Labs High Sensitivity Troponin:   Recent Labs  Lab 11/25/23 0150 11/25/23 0340 11/25/23 0831 11/25/23 1003  TROPONINIHS 779* 1,372* 5,199* 7,295*     Chemistry Recent Labs  Lab 11/25/23 0150 11/25/23 0201 11/26/23 1003  NA 137 139 138  K 4.1 4.1 3.7  CL 102 105 105  CO2 20*  --   20*  GLUCOSE 99 94 119*  BUN 10 11 8   CREATININE 1.22 1.30* 1.14  CALCIUM  9.9  --  8.5*  MG  --   --  1.9  PROT  --   --  6.0*  ALBUMIN  --   --  2.9*  AST  --   --  77*  ALT  --   --  24  ALKPHOS  --   --  63  BILITOT  --   --  0.5  GFRNONAA >60  --  >60  ANIONGAP 15  --  13    Lipids  Recent Labs  Lab 11/26/23 1003  CHOL 132  TRIG 84  HDL 52  LDLCALC 63  CHOLHDL 2.5    Hematology Recent Labs  Lab 11/25/23 0150 11/25/23 0201 11/26/23 1003 11/27/23 0219  WBC 5.5  --  5.0 5.4  RBC 4.95  --  4.80 4.71  HGB 15.4 15.6 15.2 14.6  HCT 44.6 46.0 45.3 41.8  MCV 90.1  --  94.4 88.7  MCH 31.1  --  31.7 31.0  MCHC 34.5  --  33.6 34.9  RDW 13.0  --  13.2  12.8  PLT 159  --  128* 119*   Thyroid No results for input(s): TSH, FREET4 in the last 168 hours.  BNPNo results for input(s): BNP, PROBNP in the last 168 hours.  DDimer No results for input(s): DDIMER in the last 168 hours.   Radiology  ECHOCARDIOGRAM COMPLETE Result Date: 11/25/2023    ECHOCARDIOGRAM REPORT   Patient Name:   STEWARD SAMES Date of Exam: 11/25/2023 Medical Rec #:  [id number]       Height:       70.0 in Accession #:    [id number]      Weight:       160.9 lb Date of Birth:  Nov 18, 1965       BSA:          1.903 m Patient Age:    58 years        BP:           152/85 mmHg Patient Gender: M               HR:           62 bpm. Exam Location:  Inpatient Procedure: 2D Echo, Cardiac Doppler and Color Doppler (Both Spectral and Color            Flow Doppler were utilized during procedure). Indications:    122-I22.9 Subsequent ST elevation (STEM) and non-ST elevation                 (NSTEMI) myocardial infarction  History:        Patient has prior history of Echocardiogram examinations, most                 recent 04/03/2017. CAD, Previous Myocardial Infarction and Acute                 MI, Stroke, Signs/Symptoms:Chest Pain, Altered Mental Status and                 Syncope; Risk Factors:Dyslipidemia,  Hypertension, Diabetes and                 Current Smoker. Cocaine abuse.  Sonographer:    Ellouise Mose RDCS Referring Phys: [id number] DEREK LELON CANALES IMPRESSIONS  1. Left ventricular ejection fraction, by estimation, is 55 to 60%. The left ventricle has normal function. The left ventricle has no regional wall motion abnormalities. There is mild left ventricular hypertrophy. Left ventricular diastolic parameters were normal.  2. Right ventricular systolic function is normal. The right ventricular size is normal. Tricuspid regurgitation signal is inadequate for assessing PA pressure.  3. The mitral valve is abnormal. Mild mitral valve regurgitation. No evidence of mitral stenosis.  4. The aortic valve is tricuspid. Aortic valve regurgitation is not visualized. No aortic stenosis is present.  5. The inferior vena cava is normal in size with greater than 50% respiratory variability, suggesting right atrial pressure of 3 mmHg. FINDINGS  Left Ventricle: Left ventricular ejection fraction, by estimation, is 55 to 60%. The left ventricle has normal function. The left ventricle has no regional wall motion abnormalities. The left ventricular internal cavity size was normal in size. There is  mild left ventricular hypertrophy. Left ventricular diastolic parameters were normal. Right Ventricle: The right ventricular size is normal. Right vetricular wall thickness was not well visualized. Right ventricular systolic function is normal. Tricuspid regurgitation signal is inadequate for assessing PA pressure. Left Atrium: Left atrial size was normal in size. Right Atrium: Right atrial size was normal in  size. Pericardium: There is no evidence of pericardial effusion. Mitral Valve: The mitral valve is abnormal. Mild mitral valve regurgitation. No evidence of mitral valve stenosis. Tricuspid Valve: The tricuspid valve is normal in structure. Tricuspid valve regurgitation is trivial. No evidence of tricuspid stenosis. Aortic Valve: The  aortic valve is tricuspid. Aortic valve regurgitation is not visualized. No aortic stenosis is present. Aortic valve mean gradient measures 2.9 mmHg. Aortic valve peak gradient measures 6.6 mmHg. Aortic valve area, by VTI measures 3.71 cm. Pulmonic Valve: The pulmonic valve was not well visualized. Pulmonic valve regurgitation is trivial. No evidence of pulmonic stenosis. Aorta: The aortic root and ascending aorta are structurally normal, with no evidence of dilitation. Venous: The inferior vena cava is normal in size with greater than 50% respiratory variability, suggesting right atrial pressure of 3 mmHg. IAS/Shunts: No atrial level shunt detected by color flow Doppler.  LEFT VENTRICLE PLAX 2D LVIDd:         4.67 cm      Diastology LVIDs:         3.01 cm      LV e' medial:    10.10 cm/s LV PW:         1.10 cm      LV E/e' medial:  7.1 LV IVS:        1.23 cm      LV e' lateral:   11.60 cm/s LVOT diam:     2.43 cm      LV E/e' lateral: 6.1 LV SV:         96 LV SV Index:   50 LVOT Area:     4.64 cm LV IVRT:       134 msec  LV Volumes (MOD) LV vol d, MOD A2C: 110.0 ml LV vol d, MOD A4C: 88.4 ml LV vol s, MOD A2C: 48.3 ml LV vol s, MOD A4C: 40.0 ml LV SV MOD A2C:     61.7 ml LV SV MOD A4C:     88.4 ml LV SV MOD BP:      57.9 ml RIGHT VENTRICLE             IVC RV S prime:     14.70 cm/s  IVC diam: 1.45 cm TAPSE (M-mode): 2.0 cm LEFT ATRIUM             Index        RIGHT ATRIUM           Index LA diam:        3.37 cm 1.77 cm/m   RA Area:     14.20 cm LA Vol (A2C):   48.3 ml 25.38 ml/m  RA Volume:   32.40 ml  17.02 ml/m LA Vol (A4C):   48.4 ml 25.43 ml/m LA Biplane Vol: 50.8 ml 26.69 ml/m  AORTIC VALVE AV Area (Vmax):    3.47 cm AV Area (Vmean):   3.65 cm AV Area (VTI):     3.71 cm AV Vmax:           128.33 cm/s AV Vmean:          78.595 cm/s AV VTI:            0.257 m AV Peak Grad:      6.6 mmHg AV Mean Grad:      2.9 mmHg LVOT Vmax:         95.90 cm/s LVOT Vmean:        61.800 cm/s LVOT VTI:  0.206 m  LVOT/AV VTI ratio: 0.80  AORTA Ao Root diam: 3.24 cm Ao Asc diam:  3.61 cm MITRAL VALVE MV Area (PHT): 3.72 cm    SHUNTS MV Decel Time: 204 msec    Systemic VTI:  0.21 m MV E velocity: 71.30 cm/s  Systemic Diam: 2.43 cm MV A velocity: 49.80 cm/s MV E/A ratio:  1.43 Dorn Ross MD Electronically signed by Dorn Ross MD Signature Date/Time: 11/25/2023/4:40:44 PM    Final     Cardiac Studies  TTE 11/25/23:   IMPRESSIONS     1. Left ventricular ejection fraction, by estimation, is 55 to 60%. The  left ventricle has normal function. The left ventricle has no regional  wall motion abnormalities. There is mild left ventricular hypertrophy.  Left ventricular diastolic parameters  were normal.   2. Right ventricular systolic function is normal. The right ventricular  size is normal. Tricuspid regurgitation signal is inadequate for assessing  PA pressure.   3. The mitral valve is abnormal. Mild mitral valve regurgitation. No  evidence of mitral stenosis.   4. The aortic valve is tricuspid. Aortic valve regurgitation is not  visualized. No aortic stenosis is present.   5. The inferior vena cava is normal in size with greater than 50%  respiratory variability, suggesting right atrial pressure of 3 mmHg.   Patient Profile   JAYANT KRIZ is a 58 y.o. male with a hx of CAD s/p MI (known CTO of RCA, PCI to LAD '22), CVA, DM, HTN, bipolar, seizures, schizophrenia, cocaine use, alcohol use, tobacco use who is being seen 11/25/2023 for the evaluation of chest pain/NSTEMI at the request of Dr. Carita.   Assessment & Plan    #NSTEMI #CAD s/p Prior PCI :: The patient has been n.p.o. since midnight in anticipation of LHC today.  Will follow results. -N.p.o. since midnight - LHC - Continue heparin  drip - Continue nitroglycerin  drip - Continue aspirin  81 mg daily - Continue Plavix 75 mg daily -Continue home atorvastatin  80 - Check lipid panel -Will need post cath ECG   #HTN -  Continue losartan to 50 mg daily - Continue amlodipine 10 mg daily      For questions or updates, please contact [facility] Please consult [email or web address] for contact info under       Signed, Georganna Archer, MD  11/27/2023, 8:35 AM

## 2023-11-27 NOTE — TOC CM/SW Note (Addendum)
 Transition of Care [facility]) - Inpatient Brief Assessment   Patient Details  Name: Stephen [name] MRN: [id number] Date of Birth: 25-Jun-1965  Transition of Care [facility] CM/SW Contact:    Waddell Barnie Rama, RN Phone Number: 11/27/2023, 11:18 AM   Clinical Narrative: Homeless, has no  PCP and no insurance on file, states has no HH services in place at this time or DME at home.  States he will need a bus pass at [employer], states he would like [facility] pharmacy to fill meds.  Pta self ambulatory.   He may need ast with meds at dc, he states he does not have medicaid. NCM checked with financial counselor,  she states he has restrictive coverage, his Medicaid only covers inpatient only, it does not cover medications.    Transition of Care Asessment: Insurance and Status: Insurance coverage has been reviewed Patient has primary care physician: Yes Home environment has been reviewed: Homeless Prior level of function:: indep Prior/Current Home Services: No current home services Social Drivers of Health Review: SDOH reviewed interventions complete Readmission risk has been reviewed: Yes Transition of care needs: transition of care needs identified, TOC will continue to follow

## 2023-11-27 NOTE — Progress Notes (Signed)
 Rounding Note   Patient Name: Stephen [name] Date of Encounter: 11/27/2023   [facility] Cardiologist: Oneil Parchment, MD   Subjective - No acute events overnight - Reports ongoing back discomfort but no other complaints  Scheduled Meds:  amLODipine  10 mg Oral Daily   aspirin  EC  81 mg Oral Daily   atorvastatin   80 mg Oral Daily   clopidogrel  75 mg Oral Daily   lidocaine   1 patch Transdermal Q24H   losartan  50 mg Oral Daily   Continuous Infusions:  heparin  1,000 Units/hr (11/27/23 0303)   nitroGLYCERIN  5 mcg/min (11/26/23 0623)   PRN Meds: acetaminophen , ondansetron  (ZOFRAN ) IV, oxyCODONE    Vital Signs  Vitals:   11/26/23 1941 11/26/23 2352 11/27/23 0335 11/27/23 0727  BP: (!) 153/86 (!) 167/94 (!) 163/94 (!) 165/94  Pulse: 66 63 62 60  Resp: 18 20 20 18   Temp: 98.4 F (36.9 C) 98.7 F (37.1 C) 98.5 F (36.9 C) 98.1 F (36.7 C)  TempSrc: Oral Oral Oral Oral  SpO2: 98% 97% 99% 98%  Weight:      Height:        Intake/Output Summary (Last 24 hours) at 11/27/2023 0835 Last data filed at 11/27/2023 0826 Gross per 24 hour  Intake 1240 ml  Output 2770 ml  Net -1530 ml      11/25/2023    6:00 PM 11/25/2023    3:06 AM 09/08/2023   10:06 PM  Last 3 Weights  Weight (lbs) 159 lb 13.3 oz 160 lb 15 oz 160 lb 15 oz  Weight (kg) 72.5 kg 73 kg 73 kg      Telemetry NSR- Personally Reviewed  ECG  No new ECG  Physical Exam  GEN: No acute distress.   Neck: No JVD Cardiac: RRR, no murmurs, rubs, or gallops.  Respiratory: Clear to auscultation bilaterally. GI: Soft, nontender, non-distended  MS: No edema; No deformity. Neuro:  Nonfocal  Psych: Normal affect   Labs High Sensitivity Troponin:   Recent Labs  Lab 11/25/23 0150 11/25/23 0340 11/25/23 0831 11/25/23 1003  TROPONINIHS 779* 1,372* 5,199* 7,295*     Chemistry Recent Labs  Lab 11/25/23 0150 11/25/23 0201 11/26/23 1003  NA 137 139 138  K 4.1 4.1 3.7  CL 102 105 105  CO2 20*  --   20*  GLUCOSE 99 94 119*  BUN 10 11 8   CREATININE 1.22 1.30* 1.14  CALCIUM  9.9  --  8.5*  MG  --   --  1.9  PROT  --   --  6.0*  ALBUMIN  --   --  2.9*  AST  --   --  77*  ALT  --   --  24  ALKPHOS  --   --  63  BILITOT  --   --  0.5  GFRNONAA >60  --  >60  ANIONGAP 15  --  13    Lipids  Recent Labs  Lab 11/26/23 1003  CHOL 132  TRIG 84  HDL 52  LDLCALC 63  CHOLHDL 2.5    Hematology Recent Labs  Lab 11/25/23 0150 11/25/23 0201 11/26/23 1003 11/27/23 0219  WBC 5.5  --  5.0 5.4  RBC 4.95  --  4.80 4.71  HGB 15.4 15.6 15.2 14.6  HCT 44.6 46.0 45.3 41.8  MCV 90.1  --  94.4 88.7  MCH 31.1  --  31.7 31.0  MCHC 34.5  --  33.6 34.9  RDW 13.0  --  13.2  12.8  PLT 159  --  128* 119*   Thyroid No results for input(s): TSH, FREET4 in the last 168 hours.  BNPNo results for input(s): BNP, PROBNP in the last 168 hours.  DDimer No results for input(s): DDIMER in the last 168 hours.   Radiology  ECHOCARDIOGRAM COMPLETE Result Date: 11/25/2023    ECHOCARDIOGRAM REPORT   Patient Name:   Stephen [name] Date of Exam: 11/25/2023 Medical Rec #:  [id number]       Height:       70.0 in Accession #:    [id number]      Weight:       160.9 lb Date of Birth:  Nov 18, 1965       BSA:          1.903 m Patient Age:    58 years        BP:           152/85 mmHg Patient Gender: M               HR:           62 bpm. Exam Location:  Inpatient Procedure: 2D Echo, Cardiac Doppler and Color Doppler (Both Spectral and Color            Flow Doppler were utilized during procedure). Indications:    122-I22.9 Subsequent ST elevation (STEM) and non-ST elevation                 (NSTEMI) myocardial infarction  History:        Patient has prior history of Echocardiogram examinations, most                 recent 04/03/2017. CAD, Previous Myocardial Infarction and Acute                 MI, Stroke, Signs/Symptoms:Chest Pain, Altered Mental Status and                 Syncope; Risk Factors:Dyslipidemia,  Hypertension, Diabetes and                 Current Smoker. Cocaine abuse.  Sonographer:    Ellouise Mose RDCS Referring Phys: [id number] DEREK LELON CANALES IMPRESSIONS  1. Left ventricular ejection fraction, by estimation, is 55 to 60%. The left ventricle has normal function. The left ventricle has no regional wall motion abnormalities. There is mild left ventricular hypertrophy. Left ventricular diastolic parameters were normal.  2. Right ventricular systolic function is normal. The right ventricular size is normal. Tricuspid regurgitation signal is inadequate for assessing PA pressure.  3. The mitral valve is abnormal. Mild mitral valve regurgitation. No evidence of mitral stenosis.  4. The aortic valve is tricuspid. Aortic valve regurgitation is not visualized. No aortic stenosis is present.  5. The inferior vena cava is normal in size with greater than 50% respiratory variability, suggesting right atrial pressure of 3 mmHg. FINDINGS  Left Ventricle: Left ventricular ejection fraction, by estimation, is 55 to 60%. The left ventricle has normal function. The left ventricle has no regional wall motion abnormalities. The left ventricular internal cavity size was normal in size. There is  mild left ventricular hypertrophy. Left ventricular diastolic parameters were normal. Right Ventricle: The right ventricular size is normal. Right vetricular wall thickness was not well visualized. Right ventricular systolic function is normal. Tricuspid regurgitation signal is inadequate for assessing PA pressure. Left Atrium: Left atrial size was normal in size. Right Atrium: Right atrial size was normal in  size. Pericardium: There is no evidence of pericardial effusion. Mitral Valve: The mitral valve is abnormal. Mild mitral valve regurgitation. No evidence of mitral valve stenosis. Tricuspid Valve: The tricuspid valve is normal in structure. Tricuspid valve regurgitation is trivial. No evidence of tricuspid stenosis. Aortic Valve: The  aortic valve is tricuspid. Aortic valve regurgitation is not visualized. No aortic stenosis is present. Aortic valve mean gradient measures 2.9 mmHg. Aortic valve peak gradient measures 6.6 mmHg. Aortic valve area, by VTI measures 3.71 cm. Pulmonic Valve: The pulmonic valve was not well visualized. Pulmonic valve regurgitation is trivial. No evidence of pulmonic stenosis. Aorta: The aortic root and ascending aorta are structurally normal, with no evidence of dilitation. Venous: The inferior vena cava is normal in size with greater than 50% respiratory variability, suggesting right atrial pressure of 3 mmHg. IAS/Shunts: No atrial level shunt detected by color flow Doppler.  LEFT VENTRICLE PLAX 2D LVIDd:         4.67 cm      Diastology LVIDs:         3.01 cm      LV e' medial:    10.10 cm/s LV PW:         1.10 cm      LV E/e' medial:  7.1 LV IVS:        1.23 cm      LV e' lateral:   11.60 cm/s LVOT diam:     2.43 cm      LV E/e' lateral: 6.1 LV SV:         96 LV SV Index:   50 LVOT Area:     4.64 cm LV IVRT:       134 msec  LV Volumes (MOD) LV vol d, MOD A2C: 110.0 ml LV vol d, MOD A4C: 88.4 ml LV vol s, MOD A2C: 48.3 ml LV vol s, MOD A4C: 40.0 ml LV SV MOD A2C:     61.7 ml LV SV MOD A4C:     88.4 ml LV SV MOD BP:      57.9 ml RIGHT VENTRICLE             IVC RV S prime:     14.70 cm/s  IVC diam: 1.45 cm TAPSE (M-mode): 2.0 cm LEFT ATRIUM             Index        RIGHT ATRIUM           Index LA diam:        3.37 cm 1.77 cm/m   RA Area:     14.20 cm LA Vol (A2C):   48.3 ml 25.38 ml/m  RA Volume:   32.40 ml  17.02 ml/m LA Vol (A4C):   48.4 ml 25.43 ml/m LA Biplane Vol: 50.8 ml 26.69 ml/m  AORTIC VALVE AV Area (Vmax):    3.47 cm AV Area (Vmean):   3.65 cm AV Area (VTI):     3.71 cm AV Vmax:           128.33 cm/s AV Vmean:          78.595 cm/s AV VTI:            0.257 m AV Peak Grad:      6.6 mmHg AV Mean Grad:      2.9 mmHg LVOT Vmax:         95.90 cm/s LVOT Vmean:        61.800 cm/s LVOT VTI:  0.206 m  LVOT/AV VTI ratio: 0.80  AORTA Ao Root diam: 3.24 cm Ao Asc diam:  3.61 cm MITRAL VALVE MV Area (PHT): 3.72 cm    SHUNTS MV Decel Time: 204 msec    Systemic VTI:  0.21 m MV E velocity: 71.30 cm/s  Systemic Diam: 2.43 cm MV A velocity: 49.80 cm/s MV E/A ratio:  1.43 Dorn Ross MD Electronically signed by Dorn Ross MD Signature Date/Time: 11/25/2023/4:40:44 PM    Final     Cardiac Studies  TTE 11/25/23:   IMPRESSIONS     1. Left ventricular ejection fraction, by estimation, is 55 to 60%. The  left ventricle has normal function. The left ventricle has no regional  wall motion abnormalities. There is mild left ventricular hypertrophy.  Left ventricular diastolic parameters  were normal.   2. Right ventricular systolic function is normal. The right ventricular  size is normal. Tricuspid regurgitation signal is inadequate for assessing  PA pressure.   3. The mitral valve is abnormal. Mild mitral valve regurgitation. No  evidence of mitral stenosis.   4. The aortic valve is tricuspid. Aortic valve regurgitation is not  visualized. No aortic stenosis is present.   5. The inferior vena cava is normal in size with greater than 50%  respiratory variability, suggesting right atrial pressure of 3 mmHg.   Patient Profile   Stephen [name] is a 58 y.o. male with a hx of CAD s/p MI (known CTO of RCA, PCI to LAD '22), CVA, DM, HTN, bipolar, seizures, schizophrenia, cocaine use, alcohol use, tobacco use who is being seen 11/25/2023 for the evaluation of chest pain/NSTEMI at the request of Dr. Carita.   Assessment & Plan    #NSTEMI #CAD s/p Prior PCI :: The patient has been n.p.o. since midnight in anticipation of LHC today.  Will follow results. -N.p.o. since midnight - LHC - Continue heparin  drip - Continue nitroglycerin  drip - Continue aspirin  81 mg daily - Continue Plavix 75 mg daily -Continue home atorvastatin  80 - Check lipid panel -Will need post cath ECG   #HTN -  Continue losartan to 50 mg daily - Continue amlodipine 10 mg daily      For questions or updates, please contact [facility] Please consult [email or web address] for contact info under       Signed, Georganna Archer, MD  11/27/2023, 8:35 AM

## 2023-11-27 NOTE — Progress Notes (Signed)
 11/27/2023 1:23 PM -----------------------------------------------------------[facility]--------------------------------------------------- D(Data) A(Action) R(response)     Data: Discharge Readiness Assessment EDD today 11/27/2023     Action: Chart reviewed    Response: No immediate Barriers to discharge identified at this time. ICM has resolved all noted issues and barriers for patient discharge today.      Versie Fleener, RN [employer]
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
## 2023-11-27 NOTE — Interval H&P Note (Signed)
 History and Physical Interval Note:  11/27/2023 9:23 AM  Stephen [name]  has presented today for surgery, with the diagnosis of NSTEMI.  The various methods of treatment have been discussed with the patient and family. After consideration of risks, benefits and other options for treatment, the patient has consented to  Procedure(s): LEFT HEART CATH AND CORONARY ANGIOGRAPHY (N/A) as a surgical intervention.  The patient's history has been reviewed, patient examined, no change in status, stable for surgery.  I have reviewed the patient's chart and labs.  Questions were answered to the patient's satisfaction.    Cath Lab Visit (complete for each Cath Lab visit)  Clinical Evaluation Leading to the Procedure:   ACS: Yes.    Non-ACS:  N/A  Rodolph Hagemann

## 2023-11-29 ENCOUNTER — Other Ambulatory Visit (HOSPITAL_COMMUNITY): Payer: Self-pay

## 2023-12-12 DIAGNOSIS — I639 Cerebral infarction, unspecified: Secondary | ICD-10-CM | POA: Insufficient documentation

## 2023-12-12 NOTE — Assessment & Plan Note (Deleted)
-   With how extensive his CAD is, will strive to achieve an LDL <55. - Last LDL in the hospital was 63 -Will start Zetia today. Start Zetia 10 mg daily Continue atorvastatin  80 mg daily
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
## 2023-12-12 NOTE — Assessment & Plan Note (Deleted)
 Continuing DAPT as described above

## 2023-12-13 ENCOUNTER — Ambulatory Visit
Payer: Self-pay | Attending: Student in an Organized Health Care Education/Training Program | Admitting: Student in an Organized Health Care Education/Training Program

## 2023-12-13 DIAGNOSIS — I10 Essential (primary) hypertension: Secondary | ICD-10-CM

## 2023-12-13 DIAGNOSIS — E782 Mixed hyperlipidemia: Secondary | ICD-10-CM

## 2023-12-13 DIAGNOSIS — I214 Non-ST elevation (NSTEMI) myocardial infarction: Secondary | ICD-10-CM

## 2023-12-13 DIAGNOSIS — I639 Cerebral infarction, unspecified: Secondary | ICD-10-CM

## 2023-12-13 DIAGNOSIS — I2510 Atherosclerotic heart disease of native coronary artery without angina pectoris: Secondary | ICD-10-CM

## 2023-12-15 ENCOUNTER — Ambulatory Visit
Payer: Self-pay | Attending: Student in an Organized Health Care Education/Training Program | Admitting: Student in an Organized Health Care Education/Training Program

## 2023-12-15 NOTE — Assessment & Plan Note (Deleted)
 Continuing DAPT as described above

## 2023-12-15 NOTE — Assessment & Plan Note (Deleted)
-   With how extensive his CAD is, will strive to achieve an LDL <55. - Last LDL in the hospital was 63 -Will start Zetia today. Start Zetia 10 mg daily Continue atorvastatin  80 mg daily

## 2023-12-15 NOTE — Assessment & Plan Note (Deleted)
-***   Continue DAPT with aspirin  and Plavix for 12 months post NSTEMI Continue as needed sublingual nitroglycerin  tablets for chest pain I counseled the patient on the importance of stopping illicit substance use
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
## 2024-01-17 ENCOUNTER — Ambulatory Visit (INDEPENDENT_AMBULATORY_CARE_PROVIDER_SITE_OTHER): Admitting: Primary Care
